# Patient Record
Sex: Female | Born: 1965 | Race: White | Hispanic: No | Marital: Single | Smoking: Never smoker
Health system: Southern US, Community
[De-identification: ages and names within clinical notes are randomized; demographics above are authoritative.]

## PROBLEM LIST (undated history)

## (undated) DIAGNOSIS — M722 Plantar fascial fibromatosis: Secondary | ICD-10-CM

## (undated) DIAGNOSIS — M1990 Unspecified osteoarthritis, unspecified site: Secondary | ICD-10-CM

## (undated) DIAGNOSIS — K219 Gastro-esophageal reflux disease without esophagitis: Secondary | ICD-10-CM

## (undated) DIAGNOSIS — G4730 Sleep apnea, unspecified: Secondary | ICD-10-CM

## (undated) DIAGNOSIS — E079 Disorder of thyroid, unspecified: Secondary | ICD-10-CM

## (undated) DIAGNOSIS — I10 Essential (primary) hypertension: Secondary | ICD-10-CM

## (undated) DIAGNOSIS — G5620 Lesion of ulnar nerve, unspecified upper limb: Secondary | ICD-10-CM

## (undated) DIAGNOSIS — I4891 Unspecified atrial fibrillation: Secondary | ICD-10-CM

## (undated) DIAGNOSIS — F329 Major depressive disorder, single episode, unspecified: Secondary | ICD-10-CM

## (undated) DIAGNOSIS — G4733 Obstructive sleep apnea (adult) (pediatric): Secondary | ICD-10-CM

## (undated) DIAGNOSIS — T4145XA Adverse effect of unspecified anesthetic, initial encounter: Secondary | ICD-10-CM

## (undated) DIAGNOSIS — R112 Nausea with vomiting, unspecified: Secondary | ICD-10-CM

## (undated) DIAGNOSIS — G5601 Carpal tunnel syndrome, right upper limb: Secondary | ICD-10-CM

## (undated) DIAGNOSIS — T8859XA Other complications of anesthesia, initial encounter: Secondary | ICD-10-CM

## (undated) DIAGNOSIS — R079 Chest pain, unspecified: Secondary | ICD-10-CM

## (undated) DIAGNOSIS — J45909 Unspecified asthma, uncomplicated: Secondary | ICD-10-CM

## (undated) DIAGNOSIS — T7840XA Allergy, unspecified, initial encounter: Secondary | ICD-10-CM

## (undated) DIAGNOSIS — S82899A Other fracture of unspecified lower leg, initial encounter for closed fracture: Secondary | ICD-10-CM

## (undated) DIAGNOSIS — R002 Palpitations: Secondary | ICD-10-CM

## (undated) DIAGNOSIS — R011 Cardiac murmur, unspecified: Secondary | ICD-10-CM

## (undated) DIAGNOSIS — Z9889 Other specified postprocedural states: Secondary | ICD-10-CM

## (undated) HISTORY — DX: Unspecified osteoarthritis, unspecified site: M19.90

## (undated) HISTORY — DX: Major depressive disorder, single episode, unspecified: F32.9

## (undated) HISTORY — DX: Gastro-esophageal reflux disease without esophagitis: K21.9

## (undated) HISTORY — DX: Essential (primary) hypertension: I10

## (undated) HISTORY — DX: Allergy, unspecified, initial encounter: T78.40XA

## (undated) HISTORY — PX: ABDOMINAL HYSTERECTOMY: SHX81

## (undated) HISTORY — DX: Disorder of thyroid, unspecified: E07.9

## (undated) HISTORY — PX: CHOLECYSTECTOMY: SHX55

## (undated) HISTORY — DX: Sleep apnea, unspecified: G47.30

## (undated) HISTORY — DX: Cardiac murmur, unspecified: R01.1

## (undated) HISTORY — PX: FRACTURE SURGERY: SHX138

---
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
## 1998-02-15 ENCOUNTER — Ambulatory Visit (HOSPITAL_COMMUNITY): Admission: RE | Admit: 1998-02-15 | Discharge: 1998-02-15 | Payer: Self-pay | Admitting: Anesthesiology

## 1998-03-01 ENCOUNTER — Encounter: Admission: RE | Admit: 1998-03-01 | Discharge: 1998-05-30 | Payer: Self-pay | Admitting: *Deleted

## 1998-08-02 ENCOUNTER — Inpatient Hospital Stay (HOSPITAL_COMMUNITY): Admission: AD | Admit: 1998-08-02 | Discharge: 1998-08-02 | Payer: Self-pay | Admitting: Obstetrics and Gynecology

## 1998-08-17 ENCOUNTER — Emergency Department (HOSPITAL_COMMUNITY): Admission: EM | Admit: 1998-08-17 | Discharge: 1998-08-17 | Payer: Self-pay | Admitting: Emergency Medicine

## 1998-08-29 ENCOUNTER — Inpatient Hospital Stay (HOSPITAL_COMMUNITY): Admission: AD | Admit: 1998-08-29 | Discharge: 1998-08-29 | Payer: Self-pay | Admitting: Obstetrics and Gynecology

## 1999-01-09 ENCOUNTER — Encounter: Admission: RE | Admit: 1999-01-09 | Discharge: 1999-01-23 | Payer: Self-pay | Admitting: *Deleted

## 1999-06-16 ENCOUNTER — Ambulatory Visit (HOSPITAL_COMMUNITY): Admission: RE | Admit: 1999-06-16 | Discharge: 1999-06-16 | Payer: Self-pay | Admitting: Gastroenterology

## 1999-08-14 ENCOUNTER — Other Ambulatory Visit: Admission: RE | Admit: 1999-08-14 | Discharge: 1999-08-14 | Payer: Self-pay | Admitting: Obstetrics and Gynecology

## 1999-09-10 ENCOUNTER — Emergency Department (HOSPITAL_COMMUNITY): Admission: EM | Admit: 1999-09-10 | Discharge: 1999-09-10 | Payer: Self-pay | Admitting: Emergency Medicine

## 1999-09-10 ENCOUNTER — Encounter: Payer: Self-pay | Admitting: Emergency Medicine

## 1999-09-17 ENCOUNTER — Ambulatory Visit (HOSPITAL_COMMUNITY): Admission: RE | Admit: 1999-09-17 | Discharge: 1999-09-17 | Payer: Self-pay | Admitting: Family Medicine

## 2000-04-05 ENCOUNTER — Encounter: Payer: Self-pay | Admitting: Occupational Medicine

## 2000-04-05 ENCOUNTER — Encounter: Admission: RE | Admit: 2000-04-05 | Discharge: 2000-04-05 | Payer: Self-pay | Admitting: Occupational Medicine

## 2001-01-21 ENCOUNTER — Other Ambulatory Visit: Admission: RE | Admit: 2001-01-21 | Discharge: 2001-01-21 | Payer: Self-pay | Admitting: Obstetrics and Gynecology

## 2002-03-09 ENCOUNTER — Other Ambulatory Visit: Admission: RE | Admit: 2002-03-09 | Discharge: 2002-03-09 | Payer: Self-pay | Admitting: Obstetrics and Gynecology

## 2002-06-19 ENCOUNTER — Encounter: Admission: RE | Admit: 2002-06-19 | Discharge: 2002-06-19 | Payer: Self-pay | Admitting: Obstetrics and Gynecology

## 2002-06-19 ENCOUNTER — Encounter: Payer: Self-pay | Admitting: Obstetrics and Gynecology

## 2002-07-11 ENCOUNTER — Ambulatory Visit (HOSPITAL_COMMUNITY): Admission: RE | Admit: 2002-07-11 | Discharge: 2002-07-11 | Payer: Self-pay | Admitting: Family Medicine

## 2002-07-11 ENCOUNTER — Encounter: Payer: Self-pay | Admitting: Family Medicine

## 2002-08-08 ENCOUNTER — Ambulatory Visit (HOSPITAL_COMMUNITY): Admission: RE | Admit: 2002-08-08 | Discharge: 2002-08-08 | Payer: Self-pay | Admitting: Family Medicine

## 2002-08-08 ENCOUNTER — Encounter: Payer: Self-pay | Admitting: Cardiology

## 2003-03-24 HISTORY — PX: LAPAROSCOPIC GASTRIC BYPASS: SUR771

## 2003-04-16 ENCOUNTER — Other Ambulatory Visit: Admission: RE | Admit: 2003-04-16 | Discharge: 2003-04-16 | Payer: Self-pay | Admitting: Obstetrics and Gynecology

## 2003-06-08 ENCOUNTER — Ambulatory Visit (HOSPITAL_COMMUNITY): Admission: RE | Admit: 2003-06-08 | Discharge: 2003-06-08 | Payer: Self-pay | Admitting: Obstetrics and Gynecology

## 2003-07-04 ENCOUNTER — Encounter: Admission: RE | Admit: 2003-07-04 | Discharge: 2003-10-02 | Payer: Self-pay | Admitting: General Surgery

## 2003-07-20 ENCOUNTER — Encounter (INDEPENDENT_AMBULATORY_CARE_PROVIDER_SITE_OTHER): Payer: Self-pay | Admitting: Specialist

## 2003-07-20 ENCOUNTER — Ambulatory Visit (HOSPITAL_COMMUNITY): Admission: RE | Admit: 2003-07-20 | Discharge: 2003-07-20 | Payer: Self-pay | Admitting: Obstetrics and Gynecology

## 2003-09-06 ENCOUNTER — Ambulatory Visit: Admission: RE | Admit: 2003-09-06 | Discharge: 2003-09-06 | Payer: Self-pay | Admitting: Critical Care Medicine

## 2003-09-07 ENCOUNTER — Ambulatory Visit (HOSPITAL_COMMUNITY): Admission: RE | Admit: 2003-09-07 | Discharge: 2003-09-07 | Payer: Self-pay | Admitting: Critical Care Medicine

## 2003-10-03 ENCOUNTER — Encounter: Admission: RE | Admit: 2003-10-03 | Discharge: 2003-10-03 | Payer: Self-pay | Admitting: General Surgery

## 2003-10-13 ENCOUNTER — Emergency Department (HOSPITAL_COMMUNITY): Admission: EM | Admit: 2003-10-13 | Discharge: 2003-10-13 | Payer: Self-pay | Admitting: Emergency Medicine

## 2004-01-01 ENCOUNTER — Inpatient Hospital Stay (HOSPITAL_COMMUNITY): Admission: RE | Admit: 2004-01-01 | Discharge: 2004-01-04 | Payer: Self-pay | Admitting: General Surgery

## 2004-01-08 ENCOUNTER — Encounter: Admission: RE | Admit: 2004-01-08 | Discharge: 2004-04-07 | Payer: Self-pay | Admitting: General Surgery

## 2004-03-23 HISTORY — PX: LAPAROSCOPIC GASTRIC BYPASS: SUR771

## 2004-04-30 ENCOUNTER — Other Ambulatory Visit: Admission: RE | Admit: 2004-04-30 | Discharge: 2004-04-30 | Payer: Self-pay | Admitting: Obstetrics and Gynecology

## 2004-06-16 ENCOUNTER — Encounter: Admission: RE | Admit: 2004-06-16 | Discharge: 2004-09-14 | Payer: Self-pay | Admitting: General Surgery

## 2004-06-23 ENCOUNTER — Encounter (INDEPENDENT_AMBULATORY_CARE_PROVIDER_SITE_OTHER): Payer: Self-pay | Admitting: Specialist

## 2004-06-23 ENCOUNTER — Encounter (INDEPENDENT_AMBULATORY_CARE_PROVIDER_SITE_OTHER): Payer: Self-pay | Admitting: *Deleted

## 2004-06-23 ENCOUNTER — Ambulatory Visit (HOSPITAL_COMMUNITY): Admission: RE | Admit: 2004-06-23 | Discharge: 2004-06-23 | Payer: Self-pay | Admitting: Obstetrics and Gynecology

## 2004-09-09 ENCOUNTER — Ambulatory Visit: Admission: RE | Admit: 2004-09-09 | Discharge: 2004-09-09 | Payer: Self-pay | Admitting: Gynecologic Oncology

## 2004-09-16 ENCOUNTER — Encounter: Admission: RE | Admit: 2004-09-16 | Discharge: 2004-12-15 | Payer: Self-pay | Admitting: General Surgery

## 2004-09-24 ENCOUNTER — Ambulatory Visit: Payer: Self-pay | Admitting: Critical Care Medicine

## 2004-10-20 ENCOUNTER — Ambulatory Visit (HOSPITAL_BASED_OUTPATIENT_CLINIC_OR_DEPARTMENT_OTHER): Admission: RE | Admit: 2004-10-20 | Discharge: 2004-10-20 | Payer: Self-pay | Admitting: Critical Care Medicine

## 2004-10-30 ENCOUNTER — Ambulatory Visit: Payer: Self-pay | Admitting: Pulmonary Disease

## 2004-11-03 ENCOUNTER — Ambulatory Visit: Payer: Self-pay | Admitting: Critical Care Medicine

## 2004-12-26 ENCOUNTER — Inpatient Hospital Stay (HOSPITAL_COMMUNITY): Admission: AD | Admit: 2004-12-26 | Discharge: 2004-12-26 | Payer: Self-pay | Admitting: Obstetrics and Gynecology

## 2005-01-16 ENCOUNTER — Inpatient Hospital Stay (HOSPITAL_COMMUNITY): Admission: AD | Admit: 2005-01-16 | Discharge: 2005-01-16 | Payer: Self-pay | Admitting: Obstetrics and Gynecology

## 2005-02-05 ENCOUNTER — Encounter (INDEPENDENT_AMBULATORY_CARE_PROVIDER_SITE_OTHER): Payer: Self-pay | Admitting: *Deleted

## 2005-02-05 ENCOUNTER — Ambulatory Visit (HOSPITAL_COMMUNITY): Admission: RE | Admit: 2005-02-05 | Discharge: 2005-02-05 | Payer: Self-pay | Admitting: Obstetrics and Gynecology

## 2005-04-17 ENCOUNTER — Ambulatory Visit: Payer: Self-pay | Admitting: Critical Care Medicine

## 2005-06-04 ENCOUNTER — Other Ambulatory Visit: Admission: RE | Admit: 2005-06-04 | Discharge: 2005-06-04 | Payer: Self-pay | Admitting: Obstetrics and Gynecology

## 2005-07-09 ENCOUNTER — Ambulatory Visit: Payer: Self-pay | Admitting: Cardiology

## 2005-07-16 ENCOUNTER — Ambulatory Visit: Payer: Self-pay | Admitting: Cardiology

## 2005-08-07 ENCOUNTER — Ambulatory Visit (HOSPITAL_COMMUNITY): Admission: RE | Admit: 2005-08-07 | Discharge: 2005-08-07 | Payer: Self-pay | Admitting: Surgery

## 2005-08-12 ENCOUNTER — Ambulatory Visit (HOSPITAL_COMMUNITY): Admission: RE | Admit: 2005-08-12 | Discharge: 2005-08-12 | Payer: Self-pay | Admitting: General Surgery

## 2005-08-14 ENCOUNTER — Ambulatory Visit: Payer: Self-pay | Admitting: Critical Care Medicine

## 2005-10-22 ENCOUNTER — Encounter: Admission: RE | Admit: 2005-10-22 | Discharge: 2005-10-22 | Payer: Self-pay | Admitting: Obstetrics and Gynecology

## 2006-02-15 ENCOUNTER — Ambulatory Visit: Payer: Self-pay | Admitting: Critical Care Medicine

## 2006-02-24 ENCOUNTER — Ambulatory Visit: Payer: Self-pay | Admitting: Critical Care Medicine

## 2006-04-01 ENCOUNTER — Ambulatory Visit: Payer: Self-pay | Admitting: Critical Care Medicine

## 2006-04-23 ENCOUNTER — Ambulatory Visit: Payer: Self-pay | Admitting: Critical Care Medicine

## 2006-05-16 ENCOUNTER — Inpatient Hospital Stay (HOSPITAL_COMMUNITY): Admission: AD | Admit: 2006-05-16 | Discharge: 2006-05-16 | Payer: Self-pay | Admitting: Obstetrics and Gynecology

## 2006-06-21 ENCOUNTER — Ambulatory Visit: Payer: Self-pay | Admitting: Critical Care Medicine

## 2006-07-18 ENCOUNTER — Inpatient Hospital Stay (HOSPITAL_COMMUNITY): Admission: AD | Admit: 2006-07-18 | Discharge: 2006-07-18 | Payer: Self-pay | Admitting: Obstetrics and Gynecology

## 2006-07-21 ENCOUNTER — Inpatient Hospital Stay (HOSPITAL_COMMUNITY): Admission: AD | Admit: 2006-07-21 | Discharge: 2006-07-21 | Payer: Self-pay | Admitting: Obstetrics and Gynecology

## 2006-08-03 ENCOUNTER — Inpatient Hospital Stay (HOSPITAL_COMMUNITY): Admission: AD | Admit: 2006-08-03 | Discharge: 2006-08-07 | Payer: Self-pay | Admitting: Obstetrics and Gynecology

## 2006-08-04 ENCOUNTER — Encounter (INDEPENDENT_AMBULATORY_CARE_PROVIDER_SITE_OTHER): Payer: Self-pay | Admitting: Specialist

## 2006-08-20 ENCOUNTER — Inpatient Hospital Stay (HOSPITAL_COMMUNITY): Admission: AD | Admit: 2006-08-20 | Discharge: 2006-08-22 | Payer: Self-pay | Admitting: Obstetrics and Gynecology

## 2006-10-13 ENCOUNTER — Ambulatory Visit: Payer: Self-pay | Admitting: Critical Care Medicine

## 2006-11-05 ENCOUNTER — Ambulatory Visit (HOSPITAL_COMMUNITY): Admission: RE | Admit: 2006-11-05 | Discharge: 2006-11-05 | Payer: Self-pay | Admitting: Orthopedic Surgery

## 2006-11-29 ENCOUNTER — Ambulatory Visit: Payer: Self-pay | Admitting: Cardiology

## 2006-12-06 ENCOUNTER — Ambulatory Visit: Payer: Self-pay | Admitting: Cardiology

## 2007-01-10 DIAGNOSIS — G4710 Hypersomnia, unspecified: Secondary | ICD-10-CM | POA: Insufficient documentation

## 2007-01-10 DIAGNOSIS — G4730 Sleep apnea, unspecified: Secondary | ICD-10-CM

## 2007-01-10 DIAGNOSIS — J45909 Unspecified asthma, uncomplicated: Secondary | ICD-10-CM | POA: Insufficient documentation

## 2007-02-09 ENCOUNTER — Encounter: Payer: Self-pay | Admitting: Family Medicine

## 2007-03-24 LAB — CONVERTED CEMR LAB: Pap Smear: NORMAL

## 2007-05-11 ENCOUNTER — Ambulatory Visit: Payer: Self-pay | Admitting: Critical Care Medicine

## 2007-05-11 DIAGNOSIS — K219 Gastro-esophageal reflux disease without esophagitis: Secondary | ICD-10-CM | POA: Insufficient documentation

## 2007-05-11 DIAGNOSIS — J309 Allergic rhinitis, unspecified: Secondary | ICD-10-CM | POA: Insufficient documentation

## 2007-05-11 HISTORY — DX: Gastro-esophageal reflux disease without esophagitis: K21.9

## 2007-05-12 DIAGNOSIS — G4733 Obstructive sleep apnea (adult) (pediatric): Secondary | ICD-10-CM | POA: Insufficient documentation

## 2007-05-12 DIAGNOSIS — G4730 Sleep apnea, unspecified: Secondary | ICD-10-CM

## 2007-10-27 ENCOUNTER — Encounter: Admission: RE | Admit: 2007-10-27 | Discharge: 2007-10-27 | Payer: Self-pay | Admitting: Obstetrics and Gynecology

## 2008-06-18 ENCOUNTER — Ambulatory Visit: Payer: Self-pay | Admitting: Family Medicine

## 2008-06-18 DIAGNOSIS — I10 Essential (primary) hypertension: Secondary | ICD-10-CM

## 2008-06-18 DIAGNOSIS — J209 Acute bronchitis, unspecified: Secondary | ICD-10-CM | POA: Insufficient documentation

## 2008-06-18 HISTORY — DX: Essential (primary) hypertension: I10

## 2008-06-20 ENCOUNTER — Ambulatory Visit: Payer: Self-pay | Admitting: Critical Care Medicine

## 2008-07-31 ENCOUNTER — Ambulatory Visit: Payer: Self-pay | Admitting: Family Medicine

## 2008-07-31 DIAGNOSIS — A088 Other specified intestinal infections: Secondary | ICD-10-CM | POA: Insufficient documentation

## 2008-08-01 LAB — CONVERTED CEMR LAB
CO2: 25 meq/L (ref 19–32)
Chloride: 114 meq/L — ABNORMAL HIGH (ref 96–112)
Glucose, Bld: 89 mg/dL (ref 70–99)
Sodium: 143 meq/L (ref 135–145)

## 2008-08-07 ENCOUNTER — Encounter (INDEPENDENT_AMBULATORY_CARE_PROVIDER_SITE_OTHER): Payer: Self-pay | Admitting: Obstetrics and Gynecology

## 2008-08-07 ENCOUNTER — Ambulatory Visit (HOSPITAL_COMMUNITY): Admission: RE | Admit: 2008-08-07 | Discharge: 2008-08-08 | Payer: Self-pay | Admitting: Obstetrics and Gynecology

## 2008-08-13 DIAGNOSIS — M722 Plantar fascial fibromatosis: Secondary | ICD-10-CM | POA: Insufficient documentation

## 2008-08-13 DIAGNOSIS — N809 Endometriosis, unspecified: Secondary | ICD-10-CM | POA: Insufficient documentation

## 2008-08-13 DIAGNOSIS — E282 Polycystic ovarian syndrome: Secondary | ICD-10-CM | POA: Insufficient documentation

## 2008-08-13 DIAGNOSIS — E669 Obesity, unspecified: Secondary | ICD-10-CM | POA: Insufficient documentation

## 2008-08-13 DIAGNOSIS — I491 Atrial premature depolarization: Secondary | ICD-10-CM | POA: Insufficient documentation

## 2008-09-03 ENCOUNTER — Ambulatory Visit: Payer: Self-pay | Admitting: Family Medicine

## 2008-09-03 DIAGNOSIS — L259 Unspecified contact dermatitis, unspecified cause: Secondary | ICD-10-CM | POA: Insufficient documentation

## 2008-10-24 ENCOUNTER — Ambulatory Visit: Payer: Self-pay | Admitting: Critical Care Medicine

## 2009-01-28 ENCOUNTER — Encounter: Admission: RE | Admit: 2009-01-28 | Discharge: 2009-01-28 | Payer: Self-pay | Admitting: Obstetrics and Gynecology

## 2009-10-28 ENCOUNTER — Ambulatory Visit: Payer: Self-pay | Admitting: Family Medicine

## 2009-10-28 DIAGNOSIS — F329 Major depressive disorder, single episode, unspecified: Secondary | ICD-10-CM

## 2009-10-28 DIAGNOSIS — F3289 Other specified depressive episodes: Secondary | ICD-10-CM

## 2009-10-28 DIAGNOSIS — I7300 Raynaud's syndrome without gangrene: Secondary | ICD-10-CM | POA: Insufficient documentation

## 2009-10-28 DIAGNOSIS — F321 Major depressive disorder, single episode, moderate: Secondary | ICD-10-CM | POA: Insufficient documentation

## 2009-10-28 HISTORY — DX: Other specified depressive episodes: F32.89

## 2009-10-28 HISTORY — DX: Major depressive disorder, single episode, unspecified: F32.9

## 2010-01-02 ENCOUNTER — Ambulatory Visit: Payer: Self-pay | Admitting: Family Medicine

## 2010-04-03 ENCOUNTER — Ambulatory Visit
Admission: RE | Admit: 2010-04-03 | Discharge: 2010-04-03 | Payer: Self-pay | Source: Home / Self Care | Attending: Family Medicine | Admitting: Family Medicine

## 2010-04-12 ENCOUNTER — Encounter: Payer: Self-pay | Admitting: General Surgery

## 2010-04-14 ENCOUNTER — Encounter: Payer: Self-pay | Admitting: Obstetrics and Gynecology

## 2010-04-22 NOTE — Assessment & Plan Note (Signed)
Summary: flu shot/njr   Nurse Visit   Allergies: 1)  ! [facility]vs Latex Gloves Medium (Disposable Gloves) 2)  ! Augmentin 3)  ! Erythromycin  Orders Added: 1)  Admin 1st Vaccine [90471] 2)  Flu Vaccine 1yrs + [[address]] Flu Vaccine Consent Questions     Do you have a history of severe allergic reactions to this vaccine? no    Any prior history of allergic reactions to egg and/or gelatin? no    Do you have a sensitivity to the preservative Thimersol? no    Do you have a past history of Guillan-Barre Syndrome? no    Do you currently have an acute febrile illness? no    Have you ever had a severe reaction to latex? no    Vaccine information given and explained to patient? yes    Are you currently pregnant? no    Lot Number:[id number]   Exp Date:09/20/2010   Site Given  Left Deltoid IM Romualdo Bolk, CMA (AAMA)  January 02, 2010 1:54 PM

## 2010-04-22 NOTE — Assessment & Plan Note (Signed)
Summary: med check and refill/cjr   Vital Signs:  Patient profile:   45 year old female Weight:      246 pounds Temp:     98.0 degrees F oral BP sitting:   110 / 72  (left arm) Cuff size:   large  Vitals Entered By: Sid Falcon LPN (October 28, 2009 1:20 PM) CC: med refills   History of Present Illness: Patient here for followup hypertension. She remains on Cardizem. Blood pressures been well-controlled. She has history of Raynaud's phenomenon still has occasional breakthrough episodes with this. She has been on dihydropyridine calcium channel blocker the past but had problems with edema.  She has no history of depression and anxiety symptoms controlled with Prozac 40 mg daily. Needs refills. No side effects from medication.  Compliant with all meds.  Asthma well controlled. Recent visit with pulmonologist.  Allergies: 1)  ! Cvs Latex Gloves Medium (Disposable Gloves) 2)  ! Augmentin 3)  ! Erythromycin  Past History:  Past Medical History: Last updated: 08/13/2008 Asthma Sleep Apnea Morbid Obesity with weight loss after Bariatric surgery Arthritis Depression Frequent Headaches/Migraines Hypertension Hay Fever/Allergies Heart Murmur osteopenia hx paroxysmal atrial tachycardia endometriosis Raynaud's Allergic rhinitis GERD  Past Surgical History: Last updated: 08/13/2008 Gastric Bypass 2005 Lap Laser Endometriosis D&C 2006 C-Section 2008 Cholecystectomy 1995 wisdom teeth ext  1989  Family History: Last updated: 08/13/2008 Asthma MI/Heart Attack Family History High cholesterol Family History Hypertension Family History of Cardiovascular disorder Diabetes parent atrial fib mother Family History of Cervical cancer Family History of Skin cancer--basal cell  father Family History Breast cancer 1st degree relative <50--aunt after age 59  Social History: Last updated: 06/18/2008 Patient never smoked.  RN case Production designer, theatre/television/film at [employer]  Alcohol  use-no Single  Risk Factors: Smoking Status: never (10/24/2008) PMH-FH-SH reviewed for relevance  Review of Systems  The patient denies anorexia, fever, weight loss, weight gain, chest pain, syncope, dyspnea on exertion, peripheral edema, prolonged cough, headaches, hemoptysis, abdominal pain, melena, hematochezia, and severe indigestion/heartburn.    Physical Exam  General:  Well-developed,well-nourished,in no acute distress; alert,appropriate and cooperative throughout examination Head:  Normocephalic and atraumatic without obvious abnormalities. No apparent alopecia or balding. Eyes:  No corneal or conjunctival inflammation noted. EOMI. Perrla. Funduscopic exam benign, without hemorrhages, exudates or papilledema. Vision grossly normal. Ears:  External ear exam shows no significant lesions or deformities.  Otoscopic examination reveals clear canals, tympanic membranes are intact bilaterally without bulging, retraction, inflammation or discharge. Hearing is grossly normal bilaterally. Mouth:  Oral mucosa and oropharynx without lesions or exudates.  Teeth in good repair. Neck:  No deformities, masses, or tenderness noted. Lungs:  Normal respiratory effort, chest expands symmetrically. Lungs are clear to auscultation, no crackles or wheezes. Heart:  normal rate and regular rhythm.  soft 1/6 systolic murmur left sternal border and right upper sternal border. Extremities:  no edema Cervical Nodes:  No lymphadenopathy noted Psych:  normally interactive, good eye contact, not anxious appearing, and not depressed appearing.     Impression & Recommendations:  Problem # 1:  HYPERTENSION (ICD-401.9) controlled. Her updated medication list for this problem includes:    Cardizem La 240 Mg Xr24h-tab (Diltiazem hcl coated beads) ..... Once daily  Problem # 2:  DEPRESSION (ICD-311) refilled Prozac for one year. Her updated medication list for this problem includes:    Prozac 40 Mg Caps  (Fluoxetine hcl) ..... Once daily  Problem # 3:  RAYNAUDS SYNDROME (ICD-443.0) prior intolerance to dihidroperidine Ca channel  blockers.  Complete Medication List: 1)  Qvar 40 Mcg/act Aers (Beclomethasone dipropionate) .... Two puff two times a day 2)  Nasonex 50 Mcg/act Susp (Mometasone furoate) .... Two puff ea nostril once daily 3)  Cardizem La 240 Mg Xr24h-tab (Diltiazem hcl coated beads) .... Once daily 4)  Multivitamins Tabs (Multiple vitamin) .Marland Kitchen.. 1 by mouth daily 5)  Folic Acid 800 Mcg Tabs (Folic acid) .Marland Kitchen.. 1 by mouth daily 6)  Proair Hfa 108 (90 Base) Mcg/act Aers (Albuterol sulfate) .Marland Kitchen.. 1-2 puffs every 4-6 hours as needed 7)  Prevacid 15 Mg Cpdr (Lansoprazole) .... As needed 8)  Skelaxin 800 Mg Tabs (Metaxalone) .... One tab at bedtime as needed 9)  Zofran 8 Mg Tabs (Ondansetron hcl) .... As needed 10)  Topamax 100 Mg Tabs (Topiramate) .... One tab daily at hs 11)  Prozac 40 Mg Caps (Fluoxetine hcl) .... Once daily 12)  Relpax 40 Mg Tabs (Eletriptan hydrobromide) .... As needed for migraines 13)  Ambien Cr 12.5 Mg Cr-tabs (Zolpidem tartrate) .... As needed 14)  Vitamin B-12 1000 Mcg Tabs (Cyanocobalamin) .... Once daily 15)  Chewable Calcium 500-200-40 Mg-unt-mcg Chew (Calcium-vitamin d-vitamin k) .... Two times a day 16)  Calcium 1200 1200-1000 Mg-unit Chew (Calcium carbonate-vit d-min) .... Once daily 17)  Vitamin D3 1000 Unit Caps (Cholecalciferol) .... Once daily 18)  Vivelle-dot 0.1 Mg/24hr Pttw (Estradiol) .... One patch 2 times a week  Patient Instructions: 1)  It is important that you exercise reguarly at least 20 minutes 5 times a week. If you develop chest pain, have severe difficulty breathing, or feel very tired, stop exercising immediately and seek medical attention.  2)  You need to lose weight. Consider a lower calorie diet and regular exercise.  3)  Check your  Blood Pressure regularly . If it is above: 140/90  you should make an  appointment. Prescriptions: PROZAC 40 MG CAPS (FLUOXETINE HCL) once daily  #90 x 3   Entered and Authorized by:   Evelena Peat MD   Signed by:   Evelena Peat MD on 10/28/2009   Method used:   Electronically to        [facility]* (retail)       [address]       [address] Shipping/mailing       [place], [place]  [address]       Ph: [phone]       Fax: [phone]   RxID:   [id number]

## 2010-04-24 NOTE — Assessment & Plan Note (Signed)
Summary: rash on neck/njr   Vital Signs:  Patient profile:   45 year old female Weight:      247 pounds Temp:     97.8 degrees F oral BP sitting:   102 / 72  (left arm) Cuff size:   large  Vitals Entered By: Sid Falcon LPN (April 03, 2010 2:25 PM)  History of Present Illness: Pruritic rash anterior neck present for 3 days. Initially thought this was related to stethoscope. No clear precipitant. Took benadryl with some relief. Somewhat improved today. No clear exacerbating factors. No pets. No generalized rash. No fever or chills.  Allergies: 1)  ! Cvs Latex Gloves Medium (Disposable Gloves) 2)  ! Augmentin 3)  ! Erythromycin  Past History:  Past Medical History: Last updated: 08/13/2008 Asthma Sleep Apnea Morbid Obesity with weight loss after Bariatric surgery Arthritis Depression Frequent Headaches/Migraines Hypertension Hay Fever/Allergies Heart Murmur osteopenia hx paroxysmal atrial tachycardia endometriosis Raynaud's Allergic rhinitis GERD PMH reviewed for relevance  Physical Exam  General:  Well-developed,well-nourished,in no acute distress; alert,appropriate and cooperative throughout examination Neck:  No deformities, masses, or tenderness noted. see skin exam. Lungs:  Normal respiratory effort, chest expands symmetrically. Lungs are clear to auscultation, no crackles or wheezes. Heart:  Normal rate and regular rhythm. S1 and S2 normal without gallop, murmur, click, rub or other extra sounds. Skin:  nonspecific macular erythematous rash which blanches with pressure anteriorly. No scaling no pustules or vesicles.   Impression & Recommendations:  Problem # 1:  SKIN RASH, ALLERGIC (ICD-692.9) steroid cream as below and cont antihistamine Her updated medication list for this problem includes:    Triamcinolone Acetonide 0.1 % Crea (Triamcinolone acetonide) .Marland Kitchen... Apply to affected rash two times a day as needed  Complete Medication List: 1)  Qvar 40  Mcg/act Aers (Beclomethasone dipropionate) .... Two puff two times a day 2)  Nasonex 50 Mcg/act Susp (Mometasone furoate) .... Two puff ea nostril once daily 3)  Cardizem La 240 Mg Xr24h-tab (Diltiazem hcl coated beads) .... Once daily 4)  Multivitamins Tabs (Multiple vitamin) .Marland Kitchen.. 1 by mouth daily 5)  Folic Acid 800 Mcg Tabs (Folic acid) .Marland Kitchen.. 1 by mouth daily 6)  Proair Hfa 108 (90 Base) Mcg/act Aers (Albuterol sulfate) .Marland Kitchen.. 1-2 puffs every 4-6 hours as needed 7)  Prevacid 15 Mg Cpdr (Lansoprazole) .... As needed 8)  Skelaxin 800 Mg Tabs (Metaxalone) .... One tab at bedtime as needed 9)  Zofran 8 Mg Tabs (Ondansetron hcl) .... As needed 10)  Topamax 100 Mg Tabs (Topiramate) .... One tab daily at hs 11)  Prozac 40 Mg Caps (Fluoxetine hcl) .... Once daily 12)  Relpax 40 Mg Tabs (Eletriptan hydrobromide) .... As needed for migraines 13)  Ambien Cr 12.5 Mg Cr-tabs (Zolpidem tartrate) .... As needed 14)  Vitamin B-12 1000 Mcg Tabs (Cyanocobalamin) .... Once daily 15)  Chewable Calcium 500-200-40 Mg-unt-mcg Chew (Calcium-vitamin d-vitamin k) .... Two times a day 16)  Calcium 1200 1200-1000 Mg-unit Chew (Calcium carbonate-vit d-min) .... Once daily 17)  Vitamin D3 1000 Unit Caps (Cholecalciferol) .... Once daily 18)  Vivelle-dot 0.1 Mg/24hr Pttw (Estradiol) .... One patch 2 times a week 19)  Triamcinolone Acetonide 0.1 % Crea (Triamcinolone acetonide) .... Apply to affected rash two times a day as needed  Patient Instructions: 1)  Continue Benadryl or other antihistamine as needed. Prescriptions: TRIAMCINOLONE ACETONIDE 0.1 % CREA (TRIAMCINOLONE ACETONIDE) apply to affected rash two times a day as needed  #30 gm x 1   Entered and Authorized by:  Evelena Peat MD   Signed by:   Evelena Peat MD on 04/03/2010   Method used:   Electronically to        [facility]* (retail)       [address]       [address] Shipping/mailing       [place], [place]  [address]        Ph: [phone]       Fax: [phone]   RxID:   [id number]    Orders Added: 1)  Est. Patient Level III [phone]

## 2010-05-19 ENCOUNTER — Encounter: Payer: Self-pay | Admitting: Family Medicine

## 2010-05-19 ENCOUNTER — Ambulatory Visit (INDEPENDENT_AMBULATORY_CARE_PROVIDER_SITE_OTHER): Payer: 59 | Admitting: Family Medicine

## 2010-05-19 VITALS — BP 118/80 | Temp 98.2°F | Ht 70.0 in | Wt 250.0 lb

## 2010-05-19 DIAGNOSIS — R42 Dizziness and giddiness: Secondary | ICD-10-CM

## 2010-05-19 NOTE — Progress Notes (Signed)
  Subjective:    Patient ID: [name], female    DOB: October 09, 1965, 45 y.o.   MRN: [id number]  HPI  Patient is seen with 4 weeks of intermittent vertigo-type symptoms. Worse first thing in the morning and when rolling to the left side. Symptoms seem to be worse supine. Denies any lightheadedness or presyncopal or syncopal type symptoms. Denies nausea, vomiting, or hearing changes. No ataxia. No nasal congestion but did have cold-like symptoms at onset. No alleviating factors.   Review of Systems  Neurological: Positive for dizziness. Negative for tremors, seizures, syncope and light-headedness.  Hematological: Negative for adenopathy.       Objective:   Physical Exam  patient is alert and in no distress.  Pupils equal round reactive to light. Fundi benign Eardrums appear normal Oropharynx is clear Neck is supple no adenopathy Chest good auscultation Heart regular rhythm and rate with no murmur Neurologic exam cerebellar function normal by finger to nose testing. No focal strength deficits. Gait normal. No nystagmus at this time. Cranial nerves II through XII intact. Vertigo symptoms produced with head turned to the left in supine to sitting       Assessment & Plan:   Vertigo. Suspect benign positional vertigo. Referral to consider repositioning maneuvers/vestibular rehab given duration of symptoms.

## 2010-07-01 LAB — COMPREHENSIVE METABOLIC PANEL
Albumin: 3.6 g/dL (ref 3.5–5.2)
Alkaline Phosphatase: 106 U/L (ref 39–117)
BUN: 8 mg/dL (ref 6–23)
CO2: 23 mEq/L (ref 19–32)
Chloride: 113 mEq/L — ABNORMAL HIGH (ref 96–112)
Creatinine, Ser: 0.76 mg/dL (ref 0.4–1.2)
GFR calc non Af Amer: >60 mL/min (ref >60)
Glucose, Bld: 94 mg/dL (ref 70–99)
Potassium: 3.4 mEq/L — ABNORMAL LOW (ref 3.5–5.1)
Total Bilirubin: 0.5 mg/dL (ref 0.3–1.2)

## 2010-07-01 LAB — CBC
HCT: 37 % (ref 36.0–46.0)
Hemoglobin: 13 g/dL (ref 12.0–15.0)
MCV: 100 fL (ref 78.0–100.0)
MCV: 97.6 fL (ref 78.0–100.0)
Platelets: 234 10*3/uL (ref 150–400)
Platelets: 227 10*3/uL (ref 150–400)
RBC: 3.24 MIL/uL — ABNORMAL LOW (ref 3.87–5.11)
RBC: 3.79 MIL/uL — ABNORMAL LOW (ref 3.87–5.11)
WBC: 9.5 10*3/uL (ref 4.0–10.5)
WBC: 4.9 10*3/uL (ref 4.0–10.5)

## 2010-08-05 NOTE — Op Note (Signed)
NAME:  [name], [name]              ACCOUNT NO.:  [id number]   MEDICAL RECORD NO.:  [id number]          PATIENT TYPE:  INP   LOCATION:  9123                          FACILITY:  [facility]   PHYSICIAN:  Guy Sandifer. Henderson Cloud, M.D. DATE OF BIRTH:  1965-04-23   DATE OF PROCEDURE:  08/04/2006  DATE OF DISCHARGE:                               OPERATIVE REPORT   PREOPERATIVE DIAGNOSIS:  1. Intrauterine pregnancy at 38-1/7 weeks estimated additional age.  2. Pregnancy-induced hypertension.  3. Failed induction of labor.   POSTOPERATIVE DIAGNOSIS:  1. Intrauterine pregnancy at 38-1/7 weeks estimated additional age.  2. Pregnancy-induced hypertension.  3. Failed induction of labor.   PROCEDURE:  Low-transverse cesarean section.   SURGEON:  Harold Hedge, M.D.   ANESTHESIA:  Spinal, Cristela Blue, M.D.   SPECIMENS:  Placenta to pathology.   ESTIMATED BLOOD LOSS:  800 mL.   FINDINGS:  Viable female infant, Apgars of 8/9 at 1 and 5 minutes  respectively.  Birth weight 7 pounds 3 ounces.  Arterial cord pH 7.29.   INDICATIONS FOR PROCEDURE:  The patient is a 45 year old single white  female G2, P0, EDC of Aug 17, 2006 with chronic hypertension with  increasingly labile blood pressures.  Details dictated history and  physical.  She is admitted to hospital for induction of labor.  She  undergo Cytotec ripening and then Pitocin.  She fails to achieve labor.  Fetal heart tones were reactive.  After discussion of the options, we  proceed to cesarean section.  Potential risks and complications have  been discussed.   PROCEDURE:  The patient is taken to operating room where she is  identified, spinal anesthetic is placed and she is placed in dorsosupine  position with a 15 degrees left lateral wedge.  She is then prepped,  Foley catheter is placed in the bladder as drain and she is draped in  sterile fashion.  After testing for adequate spinal anesthesia skin is  entered through a Pfannenstiel incision  and dissection is carried out  layers to the peritoneum.  Peritoneum is incised, extended superiorly  and inferiorly.  Vesicouterine peritoneum taken down cephalolaterally.  The bladder flap is developed.  The bladder blade was placed.  Uterus is  incised in a low transverse manner.  The uterine cavity is entered  bluntly with a hemostat.  The uterine incision is extended  cephalolaterally with fingers.  Artificial rupture of membranes for  clear fluid is carried out.  The vertex is delivered and the oronasal  pharynx were thoroughly suctioned.  Nuchal cord x1 was reduced.  Baby is  then delivered without difficulty and good cry and tone is noted.  Cord  is clamped and cut.  The baby's handed to waiting pediatrics team.  Placenta was then manually delivered and sent to pathology.  Uterine  cavity is clean.  Uterus closed in two running locking imbricating  layers of 0 Monocryl suture which achieves good hemostasis.  The rectus  fascia is closed in running fashion with 0 PDS suture starting at each  angle and meeting in the middle.  The subcutaneous tissue is  closed with  interrupted plain suture and the skin is closed with clips.  All sponge  and needle counts correct and the patient is transferred to recovery  room in stable condition.      Guy Sandifer Henderson Cloud, M.D.  Electronically Signed     JET/MEDQ  D:  08/04/2006  T:  08/04/2006  Job:  [id number]

## 2010-08-05 NOTE — Assessment & Plan Note (Signed)
[facility]                             PULMONARY OFFICE NOTE   NAME:[name], Carol MEDAGLIA                     MRN:          [id number]  DATE:10/13/2006                            DOB:          12-16-1965    This is a 45 year old white female with history of moderate persistent  asthma, obstructive sleep apnea, who recently delivered her baby with C-  section.  She is overall improving with less shortness of breath,  decreased cough.  She is on Qvar 2 sprays b.i.d. 40 mcg strength,  Nasonex 2 sprays to each nostril daily, Zegerid 40 mg daily.   PHYSICAL EXAMINATION:  VITAL SIGNS:  Temperature 99, blood pressure  130/80, pulse 71, saturation 90% in room air.  CHEST:  Showed to be completely clear without evidence of adventitious  breath sounds.  CARDIAC EXAM:  Showed a regular rate and rhythm without S3, normal S1,  S2.  ABDOMEN:  Soft, nontender.  EXTREMITIES:  Showed no edema or clubbing.  SKIN:  Clear.  NEUROLOGIC EXAM:  Intact.  HEENT EXAM:  Showed no jugular venous distension, lymphadenopathy.  Oropharynx clear.  NECK:  Supple.   IMPRESSION:  Is that of stable asthma and airways disease.   PLAN:  Cycle Zegerid to off, maintain Qvar as is, Nasonex as is, and  will see the patient back in return followup in 4 months.     Charlcie Cradle Delford Field, MD, [facility]  Electronically Signed    PEW/MedQ  DD: 10/14/2006  DT: 10/14/2006  Job #: [id number]

## 2010-08-05 NOTE — H&P (Signed)
NAME:  [name], [name]              ACCOUNT NO.:  [id number]   MEDICAL RECORD NO.:  [id number]          PATIENT TYPE:  INP   LOCATION:                                FACILITY:  [facility]   PHYSICIAN:  Guy Sandifer. Henderson Cloud, M.D. DATE OF BIRTH:  16-Mar-1966   DATE OF ADMISSION:  08/03/2006  DATE OF DISCHARGE:                              HISTORY & PHYSICAL   CHIEF COMPLAINT:  Pregnancy-induced hypertension.   HISTORY OF PRESENT ILLNESS:  This patient is a 45 year old, single,  white female, G2, P0, who conceived with artificial insemination with an  EDC of Aug 17, 2006.  Blood pressures have become increasingly labile  and have reached 144/88 at times.  She has also had increasingly severe  dependent edema.  Ultrasound on Aug 02, 2006, revealed an estimated  fetal weight of 7 pounds 2 ounces and an AFI of 12.4.  Cervix was  closed, thick, and high.  She has headaches on a daily basis but denies  epigastric pain.  She is being admitted for two-stage induction of  labor.   PAST MEDICAL HISTORY:  1. Hypertension.  2. PACs.  3. Asthma.  4. Sleep apnea treated with CPAP.  5. Positive ANA with Raynaud's phenomenon.  6. Irritable bowel syndrome and reflux disease.  7. Right Achilles tendinitis.  8. Osteoarthritis of the right knee and foot and the hands.  9. Migraine headaches.  10.History of PCOS and endometriosis.   OBSTETRIC HISTORY:  Ectopic pregnancy in November 2006.   PAST SURGICAL HISTORY:  1. Wisdom tooth extraction in 1989.  2. Laparoscopy in 1992.  3. Laparoscopic cholecystectomy and laser vaporization of      endometriosis 1995.  4. D&C hysteroscopy in 2004 and 2008.  5. Laparoscopic Rouen-Y gastric bypass in 2005.  6. D&C in 2006.   MEDICATIONS:  1. Zegerid 40/1100 daily.  2. Zoloft 50 mg daily.  3. Cardizem LA 240 mg daily.  4. Qvar 40 mcg 2 puffs b.i.d.  5. Zyrtec 10 mg nightly.  6. Nasonex 1 spray each nostril daily.  7. Prenatal vitamin.  8. Albuterol 2 puffs q. 4-6  h p.r.n.  9. Ambien CR 12.5 mg nightly p.r.n.   ALLERGIES:  1. LATEX leading to hives.  2. VINYL contact dermatitis.  3. ERYTHROMYCIN leading to nausea.   FAMILY HISTORY:  Hypertension, diabetes, hyperlipidemia, goiter, atrial  fibrillation in mother.  Basal cell carcinoma in father.  Chronic  hypertension, asthma in brother and sleep apnea in brother.   SOCIAL HISTORY:  Denies tobacco, alcohol, or drug abuse.   REVIEW OF SYSTEMS:  NEUROLOGIC: Headache as above.  CARDIAC: Chest pain.  PULMONARY:  Asthma as above.  GI:  Recent changes in bowel habits with  history of irritable bowel syndrome as above.   PHYSICAL EXAMINATION:  VITAL SIGNS: Height 5 feet 10-1/2 inches.  Weight  273 pounds.  HEENT/NECK:  Without thyromegaly.  LUNGS:  Clear to auscultation.  HEART:  Regular rate and rhythm.  BACK:  Without CVA tenderness.  BREASTS:  Not examined.  ABDOMEN:  Gravid with no epigastric tenderness.  EXTREMITIES:  2+ to  3+ dependent edema.  NEUROLOGIC:  Deep tendon reflexes 2+ without clonus.  PELVIC:  Cervix closed, thick, and high.   ASSESSMENT:  1. Intrauterine pregnancy at 38-1/7 weeks estimated gestational age.  2. Chronic hypertension with evolving pregnancy-induced hypertension.  3. Positive ANA.   PLAN:  Two-stage induction of labor.      Guy Sandifer Henderson Cloud, M.D.  Electronically Signed     JET/MEDQ  D:  08/03/2006  T:  08/03/2006  Job:  [id number]

## 2010-08-05 NOTE — H&P (Signed)
NAME:  [name], [name]              ACCOUNT NO.:  [id number]   MEDICAL RECORD NO.:  [id number]          PATIENT TYPE:  AMB   LOCATION:  SDC                           FACILITY:  [facility]   PHYSICIAN:  Guy Sandifer. Henderson Cloud, M.D. DATE OF BIRTH:  1965/08/06   DATE OF ADMISSION:  DATE OF DISCHARGE:                              HISTORY & PHYSICAL   CHIEF COMPLAINT:  Endometriosis.   HISTORY OF PRESENT ILLNESS:  This patient is a 45 year old single white  female G2, P1, who has a long established history of endometriosis.  She  has increasing pain and heavy bleeding.  She is status post Depot  Lupron, multiple surgeries, and NuvaRing, none of which have given  adequate relief.  Ultrasound on January 17, 2008, in my office revealed  the uterus measuring 8.0 x 4.9 x 5.6 cm.  Adnexa were essentially  normal.  After discussion of options, she is being admitted for  laparoscopically-assisted vaginal hysterectomy with bilateral salpingo-  oophorectomy.  Risks of surgery as well as issues of menopause have been  reviewed preoperatively.   PAST MEDICAL HISTORY:  1. Hypertension.  2. PACs.  3. Asthma.  4. History of sleep apnea.  5. Positive ANA with Raynaud phenomenon.  6. Irritable bowel syndrome and reflux.  7. Osteoarthritis of the right knee, foot, and hands.  8. Migraine headaches.  9. PCOS and insulin resistance.  10.Endometriosis.  11.History of depression.   PAST SURGICAL HISTORY:  Wisdom tooth extraction in 1989, laparoscopy in  1992, laparoscopic cholecystectomy and laser vaporization of  endometriosis in 1995, hysteroscopy D and C in 2004 and 2008,  laparoscopic Roux-en-Y gastric bypass in 2005, and D and C in 2006.   OBSTETRICAL HISTORY:  C-section x1, miscarriage x1.   MEDICATIONS:  1. QVAR 40 mcg 2 puffs b.i.d.  2. Nasonex 50 mcg 2 puffs each nostril daily.  3. Cardizem CD 240 mg daily.  4. Multivitamin twice a day.  5. Folic acid 800 mcg once a day.  6. ProAir HFA 108 mcg 1-2  puffs every 4-6 hours p.r.n.  7. Prevacid 50 mg once a day.  8. Skelaxin 800 mg nightly.  9. Zofran 8 mg p.r.n.  10.Topamax 100 mg nightly.  11.Prozac 40 mg daily.  12.NuvaRing discontinued.  13.Relpax 40 mg p.r.n. migraine.  14.Ambien CR 12.5 mg nightly p.r.n.  15.Calcium twice daily.   ALLERGIES:  LATEX, VINYL, and TAPE ADHESIVE.  She is intolerant of  ERYTHROMYCIN and AUGMENTIN both leading to diarrhea.   FAMILY HISTORY:  Positive for heart disease, asthma, hiatal hernia,  irritable bowel syndrome, thyroid disease, gallbladder disease, UTI,  osteoporosis, diabetes, hypertension, breast and uterine cancer, and  melanoma.   REVIEW OF SYSTEMS:  NEUROLOGIC:  History of migraine headache.  CARDIAC:  History of PACs.  PULMONARY:  History of asthma.  GI: Irritable bowel  syndrome.   PHYSICAL EXAMINATION:  VITAL SIGNS:  Height 5 feet and 10-1/2 inches,  weight 240.8 pounds, and blood pressure 106/66.  LUNGS:  Clear to auscultation.  HEART:  Regular rate and rhythm.  BACK:  No CVA tenderness.  ABDOMEN:  Soft  and nontender without masses.  PELVIC:  Vulva, vagina, and cervix without lesion.  Uterus is upper  normal size, mobile, mildly tender.  Adnexa, mildly tender without  palpable masses.  EXTREMITIES:  Grossly within normal limits.  NEUROLOGIC:  Grossly within normal limits.   ASSESSMENT:  Endometriosis.   PLAN:  Laparoscopically-assisted vaginal hysterectomy with bilateral  salpingo-oophorectomy.      Guy Sandifer Henderson Cloud, M.D.  Electronically Signed     JET/MEDQ  D:  08/01/2008  T:  08/02/2008  Job:  [id number]

## 2010-08-05 NOTE — Assessment & Plan Note (Signed)
[facility]                          [facility] OFFICE NOTE   NAME:[name]                     MRN:          [id number]  DATE:11/29/2006                            DOB:          1965/05/15    HISTORY OF PRESENT ILLNESS:  The patient is a 45 year old female with  prior history of suspected pulmonary hypertension, although an  echocardiographic study last year did not show any significant pulmonary  hypertension.  This lady has a history of obesity and is status post  treatment with Redux (Phen-Phen).  She also has a prior history of  laparoscopic Roux-en-Y gastric bypass procedure in 2005.  She recently  had delivered an artificially inseminated child.  She is a single  mother.  Her pregnancy was complicated by severe volume overload which  required Lasix after C-section.  The patient states that she is now  doing quite well.  She reports no chest pain, shortness of breath,  orthopnea or PND.  She denies any palpitations or syncope.  She remains  concerned about the possibility of underlying pulmonary hypertension.   MEDICATIONS:  Flintstone multivitamins, Os-Cal, folic acid, B12,  Cardizem LA 240 mg p.o. daily, inhalation therapy, Nasonex and Zoloft.   PHYSICAL EXAMINATION:  VITAL SIGNS:  Blood pressure 137/82, heart rate  75, weight 250 pounds.  NECK:  Normal carotid upstroke, no carotid bruits.  LUNGS:  Clear breath sounds bilaterally.  HEART:  Regular rate and rhythm, normal S1, S2.  No murmurs, rubs or  gallops.  ABDOMEN:  Soft, nontender, no rebound or guarding.  Good bowel sounds.  EXTREMITIES:  No clubbing, cyanosis or edema.  NEUROLOGIC:  The patient is alert, oriented and grossly nonfocal.   ASSESSMENT:  1. History of gastric bypass surgery.  2. Recent cesarean section and pregnancy.  3. History of morbid obesity and Redux therapy.  4. History of volume overload.  5. History of normal left ventricular function by  echocardiogram in      2007.  Negative workup for patent foramen ovale.   PLAN:  1. The patient is doing quite well post delivery.  She has no      significant volume overload.  2. Will repeat an echocardiographic study to reassess her PA pressure.  3. The patient can follow up with [place] in 1 year.     Learta Codding, MD,FACC  Electronically Signed    GED/MedQ  DD: 11/29/2006  DT: 11/30/2006  Job #: [id number]   cc:   Charlcie Cradle. Delford Field, MD, FCCP  Evelena Peat, M.D.

## 2010-08-05 NOTE — Op Note (Signed)
NAME:  [name], [name]              ACCOUNT NO.:  [id number]   MEDICAL RECORD NO.:  [id number]          PATIENT TYPE:  OIB   LOCATION:  9316                          FACILITY:  [facility]   PHYSICIAN:  Guy Sandifer. Henderson Cloud, M.D. DATE OF BIRTH:  09-10-1965   DATE OF PROCEDURE:  08/07/2008  DATE OF DISCHARGE:                               OPERATIVE REPORT   PREOPERATIVE DIAGNOSIS:  Endometriosis.   POSTOPERATIVE DIAGNOSIS:  Endometriosis.   PROCEDURE:  Laparoscopically-assisted vaginal hysterectomy with  bilateral salpingo-oophorectomy.   SURGEON:  Guy Sandifer. Henderson Cloud, MD   ASSISTANT:  Duke Salvia. Marcelle Overlie, MD   ANESTHESIA:  General endotracheal intubation.   SPECIMENS:  Uterus, bilateral tubes, and ovaries to pathology.   ESTIMATED BLOOD LOSS:  300 mL.   INDICATIONS AND CONSENT:  This patient is a 45 year old, single, white  female, G2, P1 with endometriosis.  Details are dictated in the history  and physical.  Laparoscopically-assisted vaginal hysterectomy and  bilateral salpingo-oophorectomy is discussed with the patient  preoperatively.  Potential risks and complications were discussed with  the patient preoperatively including, but limited to infection, organ  damage, bleeding requiring transfusion of blood products with HIV and  hepatitis acquisition, DVT, PE, pneumonia, fistula formation, pelvic  pain, laparotomy.  All questions were answered and consent was signed on  the chart.   FINDINGS:  Upper abdomen has 1 adhesion of the omentum to the anterior  abdominal wall.  There are no closed loops.  Uterus is about 6 weeks in  size.  Anterior and posterior cul-de-sacs were normal.  Tubes and  ovaries were normal bilaterally.   PROCEDURE:  The patient was taken to the operating room where she is  identified, placed in dorsal supine position and general anesthesia was  induced via endotracheal intubation.  She is then placed in the dorsal  lithotomy position, prepped abdominally and  vaginally.  Bladder was  straight catheterized.  Hulka tenaculum was placed.  The uterus as a  manipulator and she is draped in sterile fashion.  The infraumbilical  and suprapubic areas were injected in the midline with 1% plain  Xylocaine.  Small infraumbilical incision is made.  A Veress needle was  placed.  A normal syringe and drop test are noted.  Three liters of gas  were then insufflated under low pressure with good tympany in the right  upper quadrant.  A 10/11 Xcel bladeless disposable trocar sleeve is then  placed through the incision under direct visualization with the  operative laparoscopic.  This proceeds all the way to the parietal  peritoneum.  Proper pneumoperitoneum within the peritoneal cavity can be  seen through the peritoneum.  However, the peritoneum pushes away from  the tip of the trocar.  This occurs up to the field of the trocar.  The  trocar was therefore removed.  The same procedure is carried out with a  5-mm Xcel bladeless disposable trocar sleeve with a 5-mm laparoscope.  This was placed in the peritoneal cavity without difficulty.  Pneumoperitoneum is then further created under normal high-pressure flow  with the insufflator.  A 5-mm trocar sleeve  was then removed and a 10/11  has been placed again under direct visualization without difficulty.  A  small suprapubic incision was made in a 5-mm Xcel bladeless disposable  trocar sleeve was placed under direct visualization without difficulty.  The above findings were noted.  Then using the EnSeal bipolar cautery  cutting product the right infundibulopelvic ligament was taken down  coming across the mesosalpinx across the round ligament down the  vesicouterine peritoneum.  Similar procedure is carried out on the left  side.  Vesicouterine peritoneum was taken down cephalad laterally.  Good  hemostasis was noted.  Suprapubic trocar sleeve was removed.  The  instruments are removed.  Attention is turned to the  vagina.  Posterior  cul-de-sacs entered sharply.  Cervix was circumscribed with a scalpel.  Mucosa was advanced sharply and bluntly.  The gyrus bipolar cautery  instrument was then used to take down the uterosacral ligaments followed  by the bladder pillars and the uterine vessels bilaterally.  Progressive  bites were taken and the specimen is delivered.  There is bleeding from  the vaginal cuff which was controlled with interrupted sutures along the  posterior cuff.  The uterosacral ligaments were also plicated vaginal  cuff bilaterally and then plicated in the midline with a third suture.  All suture is 0 Monocryl unless otherwise designated.  Cuff was then  closed with figure-of-eights.  Foley catheter was placed in the bladder  and clear urine is noted.  Attention was returned to the abdomen.  Minor  bleeding at peritoneal edges is controlled with bipolar cautery.  Excess  fluid was removed.  A 5 mL of 1% Xylocaine had been used subcutaneously.  Additional 25 were then placed intraperitoneally.  Suprapubic trocar  sleeve was removed.  Pneumoperitoneum was reduced and good hemostasis  was again noted.  Pneumo was completely reduced and the umbilical trocar  sleeve was removed.  A 0 Vicryl suture is used to reapproximate the  deeper subcutaneous tissues under good visualization.  Skin was closed  with interrupted 3-0 Vicryl mattress sutures on both incisions and  Dermabond was placed on both incisions as well.  All counts were  correct.  The patient is awakened, taken to recovery room in stable  condition.       Guy Sandifer Henderson Cloud, M.D.  Electronically Signed     JET/MEDQ  D:  08/07/2008  T:  08/08/2008  Job:  [id number]

## 2010-08-05 NOTE — Discharge Summary (Signed)
NAME:  [name], [name]              ACCOUNT NO.:  [id number]   MEDICAL RECORD NO.:  [id number]          PATIENT TYPE:  OIB   LOCATION:  9316                          FACILITY:  [facility]   PHYSICIAN:  Guy Sandifer. Henderson Cloud, M.D. DATE OF BIRTH:  1965-10-17   DATE OF ADMISSION:  08/07/2008  DATE OF DISCHARGE:  08/08/2008                               DISCHARGE SUMMARY   ADMITTING DIAGNOSIS:  Endometriosis.   DISCHARGE DIAGNOSIS:  Endometriosis.   PROCEDURE:  On Aug 07, 2008, laparoscopically-assisted vaginal  hysterectomy and bilateral salpingo-oophorectomy.   REASON FOR ADMISSION:  This patient is a 45 year old single white  female, G2, P1 with long-established history of endometriosis.  Details  dictated in the history and physical.  She was admitted for surgical  management.   HOSPITAL COURSE:  The patient has been in the hospital and undergoes the  above procedure.  On the evening of surgery, she had good pain relief.  She was ambulating well.  Vital signs were stable.  She was afebrile  with a clear urine output.  On the day of discharge, she was tolerating  a regular diet, passing flatus, voiding, and ambulating.  Vital signs  were stable.  She remained afebrile and hemoglobin is 11.4.  Pathology  is pending.  Abdomen is soft with good bowel sounds.   CONDITION ON DISCHARGE:  Good.   DIET:  Regular, as tolerated.   ACTIVITY:  No lifting, no operation of automobiles, and no vaginal  entry.  She is to call the office for problems including, not limited to  temperature of 101 degrees, persistent nausea, vomiting, increasing  pain, or heavy vaginal bleeding.   MEDICATIONS:  1. Percocet 5/325 mg #40 one to two p.o. q.6 h. p.r.n.  2. Ibuprofen 600 mg q.6 h. p.r.n.  3. Multivitamin daily.   FOLLOWUP:  She will call the office p.r.n. hot flashes.  Followup is in  the office in 2 weeks.      Guy Sandifer Henderson Cloud, M.D.  Electronically Signed     JET/MEDQ  D:  08/08/2008  T:   08/09/2008  Job:  [id number]

## 2010-08-05 NOTE — H&P (Signed)
NAME:  [name], [name]              ACCOUNT NO.:  [id number]   MEDICAL RECORD NO.:  [id number]          PATIENT TYPE:  INP   LOCATION:  9170                          [facility]ACILITY:  [facility]   PHYSICIAN:  Duke Salvia. Marcelle Overlie, M.D.DATE OF BIRTH:  [birth year]   DATE OF ADMISSION:  08/20/2006  DATE OF DISCHARGE:                              HISTORY & PHYSICAL   CHIEF COMPLAINT:  Headache and edema.   HISTORY OF PRESENT ILLNESS:  This is a 45 year old G2, P0-0-1-1 who is  postoperative cesarean section on Aug 06, 2006.  She noted yesterday  problems with headache and increased edema.  CMET was done today that  showed elevated OT and PT in the 100+ range.  While she was in the  hospital, these have been normal.  She has significant lower extremity  edema.  Blood pressure is 130/80, but she will be admitted for  postpartum preeclampsia.   CURRENT MEDICATIONS:  1. Lasix p.o. yesterday.  2. Baby aspirin daily.  3. Tylox p.r.n. pain.   Blood type is A positive.   PAST MEDICAL HISTORY:  Please see her [facility] form for past medical  history details.   ALLERGIES:  1. Latex.  2. Vinyl.  3. Erythromycin.   PHYSICAL EXAMINATION:  VITAL SIGNS:  Temperature 98.2, blood pressure  130/82.  HEENT:  Unremarkable.  NECK:  Supple without masses.  LUNGS:  Clear.  CARDIOVASCULAR:  Regular rate and rhythm without murmurs, rubs or  gallops.  BREASTS:  Not examined.  ABDOMEN:  Soft, flat and nontender.  She had a seroma underneath the  incision, but no evidence of cellulitis.  EXTREMITIES:  The lower extremities revealed 2+ edema.  Reflexes are 1  to 2+, no clonus.   IMPRESSION:  Postpartum preeclampsia.   PLAN:  We will admit for monitoring, magnesium sulfate 4 gram load and 2  grams per hour, Lasix 20 mg IV x1.      Richard M. Marcelle Overlie, M.D.  Electronically Signed     RMH/MEDQ  D:  08/20/2006  T:  08/20/2006  Job:  [id number]

## 2010-08-07 ENCOUNTER — Ambulatory Visit (INDEPENDENT_AMBULATORY_CARE_PROVIDER_SITE_OTHER): Payer: 59 | Admitting: Family Medicine

## 2010-08-07 ENCOUNTER — Encounter: Payer: Self-pay | Admitting: Family Medicine

## 2010-08-07 VITALS — BP 110/78 | Temp 100.7°F | Wt 254.0 lb

## 2010-08-07 DIAGNOSIS — R509 Fever, unspecified: Secondary | ICD-10-CM

## 2010-08-07 LAB — POCT URINALYSIS DIPSTICK
Bilirubin, UA: NEGATIVE
Blood, UA: NEGATIVE
Nitrite, UA: NEGATIVE
Protein, UA: NEGATIVE
pH, UA: 6

## 2010-08-07 LAB — CBC WITH DIFFERENTIAL/PLATELET
Basophils Absolute: 0 10*3/uL (ref 0.0–0.1)
HCT: 34.1 % — ABNORMAL LOW (ref 36.0–46.0)
Hemoglobin: 11.8 g/dL — ABNORMAL LOW (ref 12.0–15.0)
Lymphs Abs: 1 10*3/uL (ref 0.7–4.0)
MCHC: 34.6 g/dL (ref 30.0–36.0)
MCV: 93.4 fl (ref 78.0–100.0)
Monocytes Absolute: 0.4 10*3/uL (ref 0.1–1.0)
Monocytes Relative: 12.9 % — ABNORMAL HIGH (ref 3.0–12.0)
Neutro Abs: 1.7 10*3/uL (ref 1.4–7.7)
RDW: 13.6 % (ref 11.5–14.6)

## 2010-08-07 NOTE — Patient Instructions (Signed)
Follow up promptly for any increasing fever, worsening headache, or persistent fever.

## 2010-08-07 NOTE — Progress Notes (Signed)
  Subjective:    Patient ID: [name], female    DOB: 23-Jan-1966, 45 y.o.   MRN: [id number]  HPI Patient seen with onset of illness around Sunday about 4 days ago. Predominant symptoms are fatigue, body aches, low-grade fever, and headache. Bodyache subjective somewhat improved at this time.  Pt had some headaches earlier in week she thought were migraine and took Relpax without improvement. No tick bites. Some bilateral ear pressure. She denies any cough, abdominal pain, dysuria, nasal congestion, diarrhea, or skin rashes. She had mild sore throat. Occasional mild sweats. Nonspecific arthralgias but no evidence for inflammatory arthritis  She does have some bifrontal headaches which are relatively constant and moderate severity. No recent travels. No recent change in medication.   Review of Systems  Constitutional: Positive for fever, chills, diaphoresis and fatigue. Negative for appetite change and unexpected weight change.  HENT: Negative for ear pain, neck stiffness and ear discharge.   Respiratory: Negative for cough, shortness of breath and wheezing.   Cardiovascular: Negative for chest pain, palpitations and leg swelling.  Gastrointestinal: Negative for abdominal pain and diarrhea.  Genitourinary: Negative for dysuria, hematuria and flank pain.  Skin: Negative for rash.  Neurological: Positive for headaches. Negative for dizziness.  Hematological: Negative for adenopathy. Does not bruise/bleed easily.       Objective:   Physical Exam  Constitutional: She is oriented to person, place, and time. She appears well-developed and well-nourished. No distress.  HENT:  Right Ear: External ear normal.  Left Ear: External ear normal.  Mouth/Throat: Oropharynx is clear and moist. No oropharyngeal exudate.  Neck: Neck supple. No thyromegaly present.  Cardiovascular: Normal rate, regular rhythm and normal heart sounds.   No murmur heard. Pulmonary/Chest: Effort normal and breath  sounds normal. No respiratory distress. She has no wheezes. She has no rales.  Abdominal: Soft. Bowel sounds are normal. She exhibits no distension and no mass. There is no tenderness. There is no rebound and no guarding.  Musculoskeletal: She exhibits no edema.  Lymphadenopathy:    She has no cervical adenopathy.  Neurological: She is alert and oriented to person, place, and time.  Skin: No rash noted.          Assessment & Plan:  Febrile illness. Question viral. Nonfocal exam. No evidence for pneumonia. Check urinalysis. Consider CBC.

## 2010-08-08 NOTE — Op Note (Signed)
NAME:  [name], [name]              ACCOUNT NO.:  [id number]   MEDICAL RECORD NO.:  [id number]          PATIENT TYPE:  AMB   LOCATION:  SDC                           FACILITY:  [facility]   PHYSICIAN:  Guy Sandifer. Henderson Cloud, M.D. DATE OF BIRTH:  25-Jan-1966   DATE OF PROCEDURE:  02/05/2005  DATE OF DISCHARGE:                                 OPERATIVE REPORT   PREOPERATIVE DIAGNOSES:  1.  Right lower quadrant pain.  2.  Spontaneous abortion.   POSTOPERATIVE DIAGNOSES:  1.  Right lower quadrant pain.  2.  Spontaneous abortion.   PROCEDURES:  1.  Laparoscopy.  2.  Dilatation and evacuation.  3.  1% Xylocaine paracervical block.   SURGEON:  Guy Sandifer. Henderson Cloud, M.D.   ANESTHESIA:  General with endotracheal intubation.   ESTIMATED BLOOD LOSS:  Drops.   SPECIMENS:  Endometrial curettings.   INDICATIONS AND CONSENT:  This patient is a 45 year old white female G1, P0,  who has a history of pelvic pain and endometriosis.  She has had right lower  quadrant pain that has been increasing for a number of weeks.  She also  conceived via artificial insemination and has recently been diagnosed with a  spontaneous abortion.  The exact location of the pregnancy was not  definitively proven.  She was treated with methotrexate, has falling  quantitative HCGs.  In view of her continued right lower quadrant pain,  laparoscopy, dilatation and evacuation was discussed.  Potential risks and  complications are reviewed preoperatively including but limited to  infection, uterine perforation, bowel, bladder or ureteral damage, bleeding  requiring transfusion of blood products with possible transfusion reaction,  HIV and hepatitis acquisition, DVT, PE, pneumonia.  All questions were  answered and consent signed on the chart.   FINDINGS:  Upper abdomen is grossly normal.  The appendix is normal.  Anterior and posterior cul-de-sacs are normal.  Tubes, ovaries are normal  bilaterally.  Uterus is normal in size and  contour.   PROCEDURE:  The patient is taken to the operating room, where she is  identified, placed in the dorsal supine position, and general anesthesia is  induced via endotracheal intubation.  She is then placed in the dorsal  lithotomy position, where she is prepped abdominally and vaginally, t  bladder is straight catheterized, and she is draped in sterile fashion.  This is all done with latex-free instrumentation.  Bivalve speculum is then  placed in the vagina and the anterior cervical lip is injected with 1%  Xylocaine and grasped with a single-tooth tenaculum.  A paracervical block  is placed at 2, 4, 5, 7, 8 and 10 o'clock positions with approximately 20 mL  total of 1% plain Xylocaine.  The cervix is gently progressively dilated to  a 23 Pratt dilator.  Sharp curettage is carried out for a minimal amount of  tissue.  The single-tooth tenaculum is replaced with a Hulka tenaculum and  attention is turned to the abdomen.  A small infraumbilical incision is made  and a disposable Veress needle is placed intra-abdominally with a normal  syringe and drop test on the  first attempt.  Three liters of gas are then  insufflated under low pressure with good tympany in the right upper  quadrant.  The Veress needle is removed, then using the 10/11 disposable  bladeless XL trocar sleeve and the straight diagnostic scope for direct  visualization, the trocar sleeve is placed in the peritoneal cavity.  The  diagnostic scope is then replaced with the operative scope and careful  inspection reveals no damage to surrounding tissues.  A small suprapubic  incision is made and a 5 mm disposable bladeless XL trocar sleeve is placed  under direct visualization without difficulty.  The above findings are  noted.  There is some bleeding from the subcutaneous tissues around the  umbilical trocar sleeve.  Therefore irrigation is carried out to rinse the  peritoneal cavity.  Excess fluid is removed.  The  suprapubic trocar sleeve  is removed.  Pneumoperitoneum is completely reduced and umbilical trocar  sleeve is removed.  Vicryl 2-0 suture is used to reapproximate the  subcutaneous tissue and the skin in the umbilical incision.  This is done  well above the layer of the fascia.  Both incisions are injected with 0.5%  plain Marcaine and Dermabond is applied to both skin incisions.  The Hulka  tenaculum was removed and good hemostasis is noted.  All counts are correct.  The patient is awakened, taken to recovery room in stable condition.      Guy Sandifer Henderson Cloud, M.D.  Electronically Signed     JET/MEDQ  D:  02/05/2005  T:  02/05/2005  Job:  [id number]

## 2010-08-08 NOTE — Op Note (Signed)
NAME:  [name], [name]              ACCOUNT NO.:  [id number]   MEDICAL RECORD NO.:  [id number]          PATIENT TYPE:  AMB   LOCATION:  SDC                           FACILITY:  [facility]   PHYSICIAN:  Sharlet Salina T. Hoxworth, M.D.DATE OF BIRTH:  [birth year]   DATE OF PROCEDURE:  06/23/2004  DATE OF DISCHARGE:  06/23/2004                                 OPERATIVE REPORT   PREOPERATIVE DIAGNOSIS:  Status post laparoscopic Roux-Y gastric bypass.   POSTOPERATIVE DIAGNOSIS:  Status post laparoscopic Roux-Y gastric bypass.   HISTORY OF PRESENT ILLNESS:  [name] is undergoing a laparoscopy by  Dr. Henderson Cloud for pelvic pain and possible endometriosis.  I discussed with  the patient with her history of laparoscopic Roux-Y gastric bypass I would  like to examine her bowel during the procedure to look for any internal  hernias or abnormalities.  She generally has been doing very well with just  some occasional lower abdominal cramping and gas consistent with irritable  bowel that she had prior to and since her gastric bypass.   DESCRIPTION OF PROCEDURE:  At the completion of Dr. Huel Coventry procedure, I  did place an additional 5 mm trocar in the left flank to allow for  manipulation of the bowel.  The Roux limb was identified and the  gastrojejunostomy could be visualized.  The Roux limb was traced distally  down to the jejunojejunostomy and appeared completely normal.  Looking  behind the jejunojejunostomy, there was no hernia beneath it or the afferent  limb at the site of this previous mesenteric defect closure.  Examining the  afferent limb back proximally, it was seen that about half of the afferent  limb was beneath the Roux limb on the patient's left side through Petersen's  defect.  This was easily reduced back through Petersen's defect but did not  appear to be a tight space and the afferent limb, although it had been  through this defect, did not appear dilated or abnormal in any way.  The  common channel was then traced distally and also appeared normal.  As the  patient was asymptomatic and this space did not appear constricting, I  elected not to intervene at this point.  Also, angled scopes, needle  holders, etc. were not available at [facility] at any rate.  The  procedure was then completed by Dr. Henderson Cloud.      BTH/MEDQ  D:  06/25/2004  T:  06/25/2004  Job:  [id number]

## 2010-08-08 NOTE — Op Note (Signed)
NAME:  [name], [name]              ACCOUNT NO.:  [id number]   MEDICAL RECORD NO.:  [id number]          PATIENT TYPE:  INP   LOCATION:  0153                         FACILITY:  [facility]   PHYSICIAN:  Sharlet Salina T. Hoxworth, M.D.DATE OF BIRTH:  Dec 15, 1965   DATE OF PROCEDURE:  01/01/2004  DATE OF DISCHARGE:                                 OPERATIVE REPORT   PREOPERATIVE DIAGNOSES:  Morbid obesity.   POSTOPERATIVE DIAGNOSES:  Morbid obesity.   PROCEDURE:  Laparoscopic roux-Y gastric bypass.   SURGEON:  Lorne Skeens. Hoxworth, M.D.   ASSISTANT:  Thornton Park. Daphine Deutscher, M.D.   ANESTHESIA:  General.   BRIEF HISTORY:  [name] is a 45 year old female with a long history  of morbid obesity unresponsive to multiple attempts at medically __________  and unsupervised diets. She has developed multiple comorbidities including  hypertension, sleep apnea, arthritis, stress urinary incontinence, GERD,  insulin resistance. After extensive consultation and workup detailed  elsewhere, we have elected to proceed with laparoscopic roux-Y gastric  bypass for correction of her morbid obesity. The procedure has been  discussed in detail and risks extensively discussed separately detailed.   DESCRIPTION OF PROCEDURE:  The patient was brought to the operating room,  placed in supine position on the operating table and general endotracheal  anesthesia was induced.  She received preoperative antibiotics. A Foley  catheter was placed. The abdomen was widely sterilely prepped and draped.  Local anesthesia was used to infiltrate the trocar sites. Access was  obtained with a 12 mm Optiview trocar in the left subcostal space mid  clavicular line without difficulty and pneumoperitoneum established. Under  direct vision, the 12 mm trocar was placed in the right upper quadrant  through the falciform ligament. The right mid abdomen, the left mid abdomen  and a 5 mm trocar in the left flank.  The mesentery and  transverse colon  were elevated and ligament of Treitz clearly identified. A 40 cm afferent  limb was measured and then we went about 5 cm distal to this where the  mesentery was a little more mobile and seemed to reach easily up over the  transverse colon toward the edge of the liver. At this point, the bowel was  divided with a single firing of the EndoGIA 45 mm white load stapler. The  mesentery was further mobilized dividing down to the first arcade with the  harmonic scalpel.  Both bowel ends were well perfused.  The end of the  efferent limb was marked with a piece of suture IV tubing and a 100 cm  efferent limb was then carefully measured. At this point, an anastomosis was  created between the end of the afferent limb and the side of the efferent  limb. This was accomplished with enterotomies using the harmonic scalpel and  a single firing of the EndoGIA 35 mm white load stapler.  The staple line  was inspected and was intact without bleeding. The common enterotomy was  then closed with running 2-0 Vicryl beginning on either end of the  enterotomy and tied centrally.  Following this, the mesenteric defect behind  the jejunojejunostomy was closed with a running 2-0 silk carried up onto the  jejunojejunostomy. The suture and staple lines in the jejunojejunostomy were  then treated with Tisseel tissue sealant.  At this point, attention was  turned to the upper abdomen. Through a 5 mm site in the epigastrium, the  [facility] liver retractor was placed and the blood flow of the liver  elevated with good exposure of the hiatus and upper stomach. The angle of  His was mobilized using the harmonic scalpel and dissection carried down  along the left crus toward the lesser sac with careful blunt dissection.  After this, a point along the lesser curve was identified approximately 4-5  cm from the EG junction for division of the gastric pouch. The peritoneum  along the lesser curve was incised,  dissection was carried carefully along  the posterior stomach up toward the lesser sac with the harmonic scalpel and  blunt dissection at right angles to the lesser curve.  This was dissected  for several centimeters and then an initial firing of the EndoGIA 45 mm blue  load stapler was performed at right angles to the lesser curve. Further  dissection posterior to the pouch entered the free lesser sac up toward the  angle of His.  The blue load EndoGIA 60 mm stapler was then used to create a  pouch directly up toward the angle of His and the stomach was seen to be  completely divided at this point with an Ewald tube in place with no  impingement on the EG junction. The stapler line of the remnant was then  oversewn with running 2-0 Vicryl along its length.  The efferent limb was  then brought up in an antecolic position and was seen to reach without undue  tension to the gastric pouch. The gastrojejunostomy was then created with an  initial posterior row of running 2-0 Vicryl between the efferent limb and  the staple line of the gastric pouch.  Enterotomies were then made in the  pouch and the efferent limb with the harmonic scalpel and an approximately 2  to 2 1/2 cm anastomosis was created with a single firing of the EndoGIA  white load stapler.  The staple line at the anastomosis was inspected and  appeared intact without bleeding.  The common enterotomy was then closed in  two layers initially with a running 2-0 Vicryl full thickness suture  beginning at either end of the enterotomy and tied centrally.  The Ewald  tube was then passed down through the anastomosis and an outer layer of  serial muscular running 2-0 Vicryl was placed anteriorly.  Following this,  the Ewald tube was removed.  With the efferent limb clamped near the  gastrojejunostomy, the patient was endoscoped by Dr. Ezzard Standing and with tight  insufflation of the pouch under saline there was no evidence of any air leak.  All  air was suctioned. Tisseel sealant was then placed over the  staple and suture lines of the gastrojejunostomy.  A closed suction drain  was left near the anastomosis and brought out through the lateral trocar  wound. Trocars were then removed and all CO2 evacuated. The skin incisions  were closed with staples. Sponge, needle and instrument counts were correct.  Dry sterile dressings were applied and the patient was taken to the in  satisfactory condition.      BTH/MEDQ  D:  01/01/2004  T:  01/01/2004  Job:  [id number]

## 2010-08-08 NOTE — Op Note (Signed)
NAME:  [name], [name]              ACCOUNT NO.:  [id number]   MEDICAL RECORD NO.:  [id number]          PATIENT TYPE:  AMB   LOCATION:  ENDO                         FACILITY:  [facility]   PHYSICIAN:  Sandria Bales. Ezzard Standing, M.D.  DATE OF BIRTH:  [birth year]   DATE OF PROCEDURE:  08/07/2005  DATE OF DISCHARGE:                                 OPERATIVE REPORT   PREOPERATIVE DIAGNOSIS:  Abdominal pain, right upper quadrant with nausea,  status post Roux-en-Y gastric bypass.   POSTOPERATIVE DIAGNOSIS:  Normal gastric pouch with normal gastric-jejunal  anastomosis.   SURGEON:  Sandria Bales. Ezzard Standing, M.D.   ANESTHESIA:  Demerol 50 mg, Versed 7 mg.   COMPLICATIONS:  None.   INDICATIONS FOR PROCEDURE:  [name] is a 45 year old white female who  is a patient of Dr. Sharlet Salina T. Hoxworth's, who underwent a laparoscopic  Roux-En-Y gastric bypass in October 2005.  Her initial weight was 395  pounds.  She has successfully lost down to 239 pounds, for an approximately  150 pound weight loss; however, over the last three to four months, she has  had increasing epigastric to right upper quadrant pain, sometimes associated  with nausea.  She has had a prior cholecystectomy and now comes for an upper  endoscopy to rule out ulcer disease, etc.   Of note, the patient had a prior endoscopy a number of years ago and is  terrified of being endoscoped.  Apparently she had a very bad experience and  considers herself to be very sensitive to vomiting, etc.   The indications and potential complications were explained to the patient.  The potential complications include but are not limited to bleeding and  perforation.   DESCRIPTION OF PROCEDURE:  The back of her esophagus and throat were  anesthetized with Cetacaine.  She was monitored with pulse oximetry,  electrocardiogram and blood pressure cuff, and placed on 2 liters of nasal  O2 during the procedure.  After the back of the throat was anesthetized, she  was  rolled to her left side and given 50 mg of Demerol and 7 mg of Versed.   She was kept comfortable during the procedure.  I was able to pass an  Olympus endoscope without difficulty into her gastric pouch and into her  jejunum.  I was able to go down about 15-20 cm down her Roux limb over  jejunum and identify her gastrojejunal anastomosis at 45 cm from the  incisors.  Her esophagogastric junction was approximately 39-40 cm from her  incisors, for a 5 to 6 cm pouch which appeared of normal size.  There was no  ulceration or inflammation of either jejunum or the stomach.  I did biopsy  the stomach wall for Clostridium, but she really had no evidence of any  gastritis or anything, and it was really an unremarkable test.   The scope was withdrawn to the esophagus and pulled back.  Again her  esophagus was entirely normal.  Her vocal cords were actually visualized.  They were also normal.   DISPOSITION:  She will be in touch with Dr. Johna Sheriff for  further evaluation.      Sandria Bales. Ezzard Standing, M.D.  Electronically Signed     DHN/MEDQ  D:  08/07/2005  T:  08/07/2005  Job:  [id number]   cc:   Lorne Skeens. Hoxworth, M.D.  [address], [address]  [place]  [place] [address]

## 2010-08-08 NOTE — H&P (Signed)
NAME:  Yadav, Melayah A                        ACCOUNT NO.:  [id number]   MEDICAL RECORD NO.:  [id number]                   PATIENT TYPE:  AMB   LOCATION:  SDC                                  FACILITY:  [facility]   PHYSICIAN:  Guy Sandifer. Arleta Creek, M.D.           DATE OF BIRTH:  19-Jun-1965   DATE OF ADMISSION:  07/20/2003  DATE OF DISCHARGE:                                HISTORY & PHYSICAL   CHIEF COMPLAINT:  Endometrial polyp.   HISTORY OF PRESENT ILLNESS:  This patient is a 45 year old single white  female, gravida 0, para 0, who had a compromised pelvic examination due to  habitus.  Ultrasound in my office on June 01, 2003, to evaluate the pelvis  was suspicious for an endometrial mass.  Sonohysterogram was consistent with  a 1.2 cm polypoid type mass in the endometrial cavity.  The ovaries were  essentially normal.  The patient has a history of polycystic ovarian  syndrome and irregular menses.  Options of management were discussed and the  patient is being admitted for hysteroscopy D&C.  Potential risks and  complications have been discussed with the patient preoperatively.   PAST MEDICAL HISTORY:  1. Sleep apnea.  2. Chronic hypertension.  3. Type 2 diabetes.  4. Reactive airway disease.  5. Endometriosis.  6. Polycystic ovarian syndrome.  7. Plantar fasciitis.  8. Question of connective tissue disease.  9. Partially torn left Achilles tendon.  10.      Stress fractures of the right foot.   PAST SURGICAL HISTORY:  Exploratory laparotomy in 1992.  Laparoscopic  cholecystectomy with laser ablation of endometriosis in 1995.  Wisdom teeth  extraction in 1989.   MEDICATIONS:  1. Celebrex 200 mg daily.  2. Flexeril 5 mg p.r.n.  3. Lisinopril 10 mg daily.  4. Advair 50/150 b.i.d.   ALLERGIES:  ERYTHROMYCIN leading to nausea and vomiting, AUGMENTIN leading  to nausea and vomiting, LATEX sensitivity.   PAST SURGICAL HISTORY:  Negative.   FAMILY HISTORY:  Positive for peptic  ulcer disease in father. Colon polyps  in grandmother and uncle.   SOCIAL HISTORY:  The patient denies tobacco, alcohol, or drug abuse.   REVIEW OF SYSTEMS:  NEUROLOGY:  Denies recent headache.  CARDIO:  Denies  chest pain.  PULMONARY:  Reactive airway disease as above.  ABDOMEN:  Some  abdominal pain on and off.  History of endometriosis as above.  Abdominopelvic CAT scan on June 08, 2003, was essentially normal with no  evidence of kidney stone and chest x-ray on June 08, 2003, was within  normal limits.   PHYSICAL EXAMINATION:  VITAL SIGNS:  Height 5 feet 10-1/2 inches, weight 385  pounds, blood pressure 118/68.  HEENT:  Without thyromegaly.  LUNGS: Clear to auscultation.  HEART:  Regular rate and rhythm.  BACK:  Without CVA tenderness.  BREASTS:  Pendulous without palpable mass, retraction, or discharge.  ABDOMEN:  Morbidly obese, soft,  and nontender without palpable masses.  PELVIC:  Essentially totally compromised.  EXTREMITIES:  Grossly within normal limits.   ASSESSMENT:  Endometrial polyp per sonohysterogram.   PLAN:  Hysteroscopy with D&C.                                               Guy Sandifer Arleta Creek, M.D.    JET/MEDQ  D:  07/19/2003  T:  07/19/2003  Job:  [id number]

## 2010-08-08 NOTE — Discharge Summary (Signed)
NAME:  [name], [name]              ACCOUNT NO.:  [id number]   MEDICAL RECORD NO.:  [id number]          PATIENT TYPE:  INP   LOCATION:  9161                          FACILITY:  [facility]   PHYSICIAN:  Duke Salvia. Marcelle Overlie, M.D.DATE OF BIRTH:  07/01/1965   DATE OF ADMISSION:  08/20/2006  DATE OF DISCHARGE:  08/22/2006                               DISCHARGE SUMMARY   ADMITTING DIAGNOSES:  1. Postpartum preeclampsia.  2. Two 2 weeks status post cesarean delivery.   DISCHARGE DIAGNOSIS:  Postpartum preeclampsia, resolved.   REASON FOR ADMISSION:  Please see dictated H&P.   HISTORY OF PRESENT ILLNESS:  The patient is a 45 year old, G2, P0-0-1-1  who is 2 weeks postoperative cesarean delivery admitted with complaints  of headache and increased edema.  CMET had been done in the office which  revealed an elevation of OT and PT in the 100+ range.  The patient was  now admitted for observation and magnesium sulfate.   PHYSICAL EXAMINATION:  VITAL SIGNS:  On admission, temperature was 98.2,  blood pressure 130/82.  HEART:  Regular rate and rhythm without murmur, rubs or gallops.  ABDOMEN:  Soft.  There was a small seroma that was noted inferior to the  incision, but no evidence of cellulitis.  EXTREMITIES:  Deep tendon reflexes 1-2+, no clonus with 2+ pedal edema.   HOSPITAL COURSE:  The patient was now admitted for monitoring and  magnesium sulfate 4 g load with a 2 g per hour rate.  Also, Lasix 20 mg  IV was given.  On the following morning, the patient stated that  headache was improved.  Vital signs were stable with blood pressure  130/87, 2+ lower extremity edema was continued.  Incision was clean and  dry.  Laboratory findings revealed hemoglobin of 10.9, platelet count of  281,000.  SGOT was 58, SGPT was 93.  Potassium level was noted to be  2.7.  The patient was restarted on Cardizem and Zoloft and potassium was  added to IV fluids.  The patient continued on magnesium sulfate with  labs  ordered for the following morning.   On the following morning, the patient was feeling better.  She denied  any headache or visual disturbance.  Blood pressure was 130/78.  Incision was clean, dry and intact.  Potassium was 3.5.  SGOT was 41 and  SGPT was 72.  Due to significant improvement of laboratory findings and  resolution of symptoms, the patient was discharged home.   CONDITION ON DISCHARGE:  Stable.   DIET:  Regular as tolerated.   ACTIVITY:  Up as tolerated.   SPECIAL INSTRUCTIONS:  She is to call for headache, blurred vision or  right upper quadrant pain.   FOLLOW UP:  The patient to follow up in the office in 2 days for blood  pressure check and laboratory findings.   DISCHARGE MEDICATIONS:  1. Hydrochlorothiazide 50 mg one p.o. daily.  2. Prenatal vitamins.  3. Percocet as needed.  4. Cardizem LA 240 mg daily.  5. Zoloft 100 mg per day.      Julio Sicks, N.P.  Richard M. Marcelle Overlie, M.D.  Electronically Signed    CC/MEDQ  D:  09/27/2006  T:  09/27/2006  Job:  [id number]

## 2010-08-08 NOTE — Op Note (Signed)
NAME:  [name], [name]                        ACCOUNT NO.:  [id number]   MEDICAL RECORD NO.:  [id number]                   PATIENT TYPE:  AMB   LOCATION:  SDC                                  FACILITY:  [facility]   PHYSICIAN:  Guy Sandifer. Arleta Creek, M.D.           DATE OF BIRTH:  [birth year]   DATE OF PROCEDURE:  07/20/2003  DATE OF DISCHARGE:                                 OPERATIVE REPORT   PREOPERATIVE DIAGNOSIS:  Endometrial polyp.   POSTOPERATIVE DIAGNOSIS:  Endometrial polyp.   PROCEDURE:  Hysteroscopy with resection of endometrial polyp and dilatation  and curettage.   SURGEON:  Guy Sandifer. Henderson Cloud, M.D.   ANESTHESIA:  1. Spinal, Belva Agee, M.D.  2. Marcaine 0.25% paracervical block.   ESTIMATED BLOOD LOSS:  Drops.   SPECIMENS:  1. Endometrial biopsy, right.  2. Endometrial biopsy, left.  3. Endometrial curettings.   INTAKE AND OUTPUT OF SORBITOL DISTENDING MEDIUM:  100 mL deficit.   INDICATIONS AND CONSENT:  This patient is [name] 45 year old single white female,  G0, P0, noted to have an endometrial polyp on ultrasound.  Details are  dictated in the history and physical.  Hysteroscopy with resectoscope,  dilatation and curettage has been discussed with the patient preoperatively.  Potential risks and complications are reviewed, including but not limited to  infection, uterine perforation, and bowel, bladder, or ureteral damage,  bleeding requiring transfusion of blood products with possible transfusion  reaction, HIV and hepatitis acquisition, DVT, PE, pneumonia, hysterectomy,  recurrent polyps, or abnormal bleeding.  All questions have been answered,  and consent is signed on the chart.   FINDINGS:  There is an approximately 1.5 cm pedunculated polyp in the right  upper endometrial canal.  There is [name] second 0.5 cm smooth bump in the  endometrium in the left upper endometrial canal.  Fallopian tube ostia are  identified bilaterally.   PROCEDURE:  The patient is taken  to the operating room, where she is  identified, spinal anesthetic is placed without difficulty, and she is  placed in the dorsal supine position.  She is then placed in the dorsal  lithotomy position, where she is prepped, the bladder is straight-  catheterized, and she is draped in the sterile fashion.  [name] bivalve speculum  is placed, and the anterior cervical lip is injected with 0.25% plain  Marcaine and grasped with [name] single-tooth tenaculum.  [name] paracervical block is  then placed at the 2, 4, 5, 7, 8, and 10 o'clock positions with  approximately 20 mL total of 0.25% plain Marcaine.  The cervix is then  gently progressively dilated to [name] 33 Pratt dilator.  The hysteroscopic  resectoscope with the single right angle wire loop is then placed in the  endocervical canal and advanced under direct visualization using sorbitol  distending media without difficulty.  The above findings are noted.  The  polyp on the right side is resected and delivered  and the biopsy of the  second suspicious area on the left of the endometrial canal is then biopsied  and then sent to pathology.  Sharp curettage is then carried out.  Instruments are removed.  Good hemostasis is noted.  All counts are correct.  The patient is transferred to the recovery room in stable condition.  The  patient is known to have sleep apnea.  She will be observed closely in the  recovery room.                                               Guy Sandifer Arleta Creek, M.D.    JET/MEDQ  D:  07/20/2003  T:  07/20/2003  Job:  [id number]

## 2010-08-08 NOTE — Procedures (Signed)
NAME:  [name], [name]              ACCOUNT NO.:  [id number]   MEDICAL RECORD NO.:  [id number]          PATIENT TYPE:  OUT   LOCATION:  SLEEP CENTER                 [facility]ACILITY[facility]  [facility]   PHYSICIAN:  Marcelyn Bruins, M.D. [facility] DATE OF BIRTH:  04-10-1965   DATE OF STUDY:  10/20/2004                              NOCTURNAL POLYSOMNOGRAM   REFERRING PHYSICIAN:  Dr. Shan Levans   DATE OF STUDY:  October 20, 2004   INDICATION FOR THE STUDY:  Hypersomnia with sleep apnea. The patient has  been treated with C-PAP in the past and has undergone bariatric surgery and  lost quite a bit of weight. She is returning for re-titration. Epworth  score: 5.   SLEEP ARCHITECTURE:  The patient had a total sleep time of 305 minutes with  adequate slow wave sleep but very decreased REM. Sleep onset latency was  mildly prolonged at 32-and-a-half minutes, and REM onset was very prolonged  at 318 minutes. Sleep efficiency was only 71%.   IMPRESSION:  1.  Good control of previously-diagnosed obstructive sleep apnea with 7 cm      of C-PAP. The patient did, however, have some breakthrough snoring which      required an increase in pressure to 8 cm. The patient used her gel mask      from home with her own head gear.  2.  No clinically significant cardiac arrhythmias.  3.  Large numbers of leg jerks with only mild sleep disruption. Clinical      correlation is suggested if she continues to be seen symptomatic even      after appropriate treatment C-PAP.     ______________________________  Suzzette Righter    KC/MEDQ  D:  10/31/2004 12:18:25  T:  10/31/2004 15:53:30  Job:  [id number]

## 2010-08-08 NOTE — Progress Notes (Signed)
Quick Note:  Pt informed ______ 

## 2010-08-08 NOTE — Assessment & Plan Note (Signed)
[facility]                             PULMONARY OFFICE NOTE   NAME:Blea, DONNICA JARNAGIN                     MRN:          [id number]  DATE:02/24/2006                            DOB:          [birth year]    Ms. Wilds is a 45 year old white female, history of reflux, previous  mild intermittent asthma, chronic sinus disease, rhinitis.  She comes in  today with increased postnasal drainage, increased productive cough of  clear mucus.  We gave her a course of Zithromax last week.  She is still  coughing.  She maintains Nasonex two sprays each nostril daily,  albuterol p.r.n., Advair 100/50 one spray b.i.d.   EXAMINATION:  Temperature is 98, blood pressure 110/74, pulse 75,  saturation 98% in room air.  Chest showed to be clear.  There was  evidence of pseudowheeze.  Nares showed mild nasal inflammation.   IMPRESSION:  That of allergic rhinitis, early sinusitis, reflux disease  and no evidence of asthma flare.   PLAN:  Begin amoxicillin 250 mg three times a day for 7 days, BroveX 5  mL h.s. between 5 and 7 days, increase Prilosec to 20 mg b.i.d., use  Tessalon Perles p.r.n. cough, increase Nasonex to two sprays b.i.d. each  nostril.  Return this patient to a visit in 3 weeks.     Charlcie Cradle Delford Field, MD, [facility]  Electronically Signed    PEW/MedQ  DD: 02/24/2006  DT: 02/25/2006  Job #: [id number]

## 2010-08-08 NOTE — Consult Note (Signed)
NAME:  [name], [name]              ACCOUNT NO.:  [id number]   MEDICAL RECORD NO.:  [id number]          PATIENT TYPE:  OUT   LOCATION:  GYN                          FACILITY:  [facility]   PHYSICIAN:  Paola A. Duard Brady, MD    DATE OF BIRTH:  06-May-1965   DATE OF CONSULTATION:  09/09/2004  DATE OF DISCHARGE:                                   CONSULTATION   REFERRING PHYSICIAN:  Guy Sandifer. Henderson Cloud, M.D.   The patient seen today in consultation at the request of Dr. Henderson Cloud. Ms.  [name] is a 45 year old gravida 0 who comes in for evaluation and follow-  up of multiple issues. Most recently, she underwent a laparotomy with  ablation of endometriosis, aspiration of a right ovarian cyst, hysteroscopy  with D&C, on June 23, 2004. Findings at the time included a normal uterine  cavity with normal fallopian tubes. There was single powder-burn type  implant of endometriosis on the left pelvic sidewall. The right ovary had a  3 cm translucent cyst. The cul-de-sacs were normal. The cyst was aspirated  and the cyst fluid was sent which revealed rare atypical cells in a  background of granular debris. Reading the pathology report it was  consistent with luteinized follicular cells. The patient wished to be seen  by an oncologist as she has a family history of cancer and wanted to be  proactive regarding her health maintenance. Her last cycle was a week ago.  She states her periods have been normal for the past 4 years. She has had  breakthrough bleeding in the past, most recently around her endometrial  biopsy, but prior to that it has been a while. She is currently not sexually  active. She does complain of irritable bowel syndrome. She also complains of  rectal bleeding around the time of her menses. She had a colonoscopy by  report anywhere from 4-8 years ago. It was not done during her menses and  there were no changes noted. The bleeding is intermittent. She has had pain.  While her pain subsided  and she did well after the surgery, for the past 3  weeks she has had significant increased right-sided pain. She experienced  pain with her period, prior to her period, and after her period. She is  being followed by Dr. Henderson Cloud and will be returning to see him in July for  evaluation of pain as well as per her report for repeat ultrasound. She has  a significant past medical history including depression, sleep apnea,  chronic hypertension, reactive airway disease, insulin resistance,  endometriosis, polycystic ovarian syndrome, plantar fasciitis, irritable  bowel syndrome, history of partially torn left Achilles tendon, history of a  stress fracture on the right foot. She states she turns blue and has  arrhythmias, PACs, PATs, and questionable atrial fibrillation.   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 She has  lost 140 pounds. Hysteroscopy D&C in April 2006. Hysteroscopy D&C,  diagnostic laparoscopy April 2005. Exploratory laparoscopy in 1992 for  endometriosis and pelvic pain. Laparoscopic cholecystectomy in 1995. Wisdom  tooth extraction in 1989.  MEDICATIONS:  1.  Prozac 20 mg p.o. daily.  2.  Advair 50/100 mg b.i.d.  3.  Lisinopril 5 mg daily.  4.  Cardizem 240 mg daily.  5.  Prevacid 30 mg daily.  6.  Calcium.  7.  Vitamins.  8.  Vitamin B.   ALLERGIES:  1.  LATEX.  2.  ERYTHROMYCIN.  3.  AUGMENTIN.   Erythromycin and Augmentin both lead to nausea and vomiting.   SOCIAL HISTORY:  She denies use of tobacco or alcohol. She is single. 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   FAMILY HISTORY:  Her father had basal cell and squamous cell carcinomas of  the skin in sun-exposed areas. Paternal grandmother had endometrial cancer.  She was obese. She also had basal cell carcinoma in a sun exposed area.  Paternal aunt had melanoma and breast cancer in her 42s. Paternal aunt with  basal cell cancer in a sun-exposed area.  Paternal grandfather with a  preleukemic condition.   PHYSICAL EXAMINATION:  VITAL SIGNS:  Height 5 feet 10.5 inches, weight 256  pounds, pulse 64, blood pressure 118/70, respirations 16.  GENERAL:  Well-nourished, well-developed female in no acute distress. Exam  was deferred.   ASSESSMENT:  A 45 year old with history of polycystic ovarian syndrome,  pelvic pain, and endometriosis who wanted to come in today for discussion of  gynecologic cancer risk stratification and recommendations from our  standpoint. She had a cyst aspirated in April 2006 that revealed rare  atypical cells in a background of granular debris which is thought to be  consistent with luteinized follicular cells. There is no nuclear atypia  noted.   PLAN:  1.  I do not think that the pathology from the cytology is consistent with a      malignant state of the ovary. I have a call in to Dr. Quentin Ore to discuss      this personally with her and I do not feel that this per se needs any      particular action. The patient does have multiple other gynecologic      issues and she has a follow-up appointment with Dr. Henderson Cloud. The patient      is beginning to experience increasing pelvic pain. She states she is not      a candidate for birth control pills based on her age and her      hypertension. She has taken Lupron in the past with help and benefit but      at this pont she would like to attempt conception with Clomid and IUY. I      discussed with her that I have no opinion regarding those issues, as      those are not area of my expertise and cannot offer an opinion based on      those issues. From her standpoint, I believe she has two risk factors      for gynecologic malignancy. First, she has polycystic ovarian syndrome      and relative hyperestrogen state, along with her morbid obesity can      increase her risk for endometrial hyperplasia and endometrial carcinoma.     I discussed with her that this is an issue.  However, she has regular      monthly cyclic periods which should decrease her risk of that being an      issue. With regards to ovarian cancer risk, she is nulliparous. That in  and of itself increases her risk for ovarian carcinoma. She has been on      birth control pills for a total of 10 years in her lifetime which will      decrease her risk of ovarian cancer by more than 50%. The next issue is      her bright red blood per rectum. She attributes this to endometriosis as      she only notices it when she has her menses. I recommended colonoscopy      during her menses to evaluate this issue. Her aunt who had breast cancer      is dying of diffuse metastatic disease. It would not be unreasonable      because of the affected aunts - first degree relative also has      endometrial cancer - to consider the possibility of a genetic mutation,      but I at this point do not feel that the patient needs to be tested.  2.  I communicated this with the patient, then separately I re-communicated      it with her caseworker, as well as her      mother, and I spent approximately 40 minutes face-to-face time with the      patient and her family. I will be happy to see her should the need arise      in the future but at this time I do not feel that she needs evaluation      nor intervention by a gynecologic oncologist, and she will follow up      with her primary physician.       PAG/MEDQ  D:  09/09/2004  T:  09/09/2004  Job:  [id number]   cc:   Guy Sandifer. Henderson Cloud, M.D.  [address]  [place]  [place] [address]  Fax: [phone]   Telford Nab, R.N.  [phone] [address]  [place], [place] [address]

## 2010-08-08 NOTE — Assessment & Plan Note (Signed)
[facility]                             PULMONARY OFFICE NOTE   NAME:[name], [name]                     MRN:          [id number]  DATE:04/01/2006                            DOB:          06-18-1965    Ms. Stagner is a 45 year old white female seen today for progressive  cough that reoccurred a week ago. We saw her in early December with a  similar cough syndrome. At that time, she had discolored mucus and  increased post-nasal drainage. We diagnosed acute sinusitis, allergic  rhinitis, a bronchitic flare, and reflux. She was given amoxicillin for  7 days and Brovex nightly for 7 days. We doubled her Prilosec to b.i.d..  We also gave her Tessalon-Perles for cough and Nasonex b.i.d. She  maintained her Advair 100/50 1 spray b.i.d. The cough went away, but  then it recurred a week ago. She did receive Omnicef by a primary care  Pegeen Stiger without much change.   PHYSICAL EXAMINATION:  VITAL SIGNS: Temperature 98, blood pressure  118/72, pulse 89, saturation 98% on room air.  CHEST: Clear without evidence of wheeze or rhonchi. There was a  prominent pseudo wheeze ausculted over the neck with referred sounds to  the lower airways.  NARES: Showed mild nasal inflammation.  ORAL PHARYNX: Showed mild erythema in the posterior pharyngeal area with  cobblestoning.   IMPRESSION:  Reflux disease associated with upper airway irritability  aggravated by Advair usage with no active infection. Note is made of a  negative chest x-ray today and normal spirometry on pulmonary function  testing.   PLAN:  The patient is to use cyclic cough protocol with Tessalon-Perles  and Tussionex. The protocol was given to the patient. No further  antibiotics are indicated. She will switch from Advair to Qvar 40 mcg  strength 2 sprays b.i.d. She will switch from Prilosec to 40 mg daily  Zegerid and we will see the patient for followup in a month.     Charlcie Cradle Delford Field, MD,  [facility]  Electronically Signed    PEW/MedQ  DD: 04/01/2006  DT: 04/02/2006  Job #: [id number]   cc:   Evelena Peat, M.D.

## 2010-08-08 NOTE — Op Note (Signed)
NAME:  [name], [name]              ACCOUNT NO.:  [id number]   MEDICAL RECORD NO.:  [id number]          PATIENT TYPE:  INP   LOCATION:  0153                         FACILITY:  [facility]   PHYSICIAN:  Sandria Bales. Ezzard Standing, M.D.  DATE OF BIRTH:  Sep 23, 1965   DATE OF PROCEDURE:  01/01/2004  DATE OF DISCHARGE:                                 OPERATIVE REPORT   PREOPERATIVE DIAGNOSIS:  Morbidly obese, status post Roux-Y  gastrojejunostomy.   POSTOPERATIVE DIAGNOSIS:  Morbidly obese with status post laparoscopic Roux-  Y gastrojejunostomy with normal anastomosis.   PROCEDURE:  Esophagogastroscopy.   SURGEON:  Dr. Ezzard Standing   FIRST ASSISTANT:  None.   ANESTHESIA:  General endotracheal.   ESTIMATED BLOOD LOSS:  Minimal.   INDICATION FOR PROCEDURE:  [name] is a 45 year old female, who is  morbidly obese with a BMI of approximately 50.  Dr. Jaclynn Guarneri has done a  laparoscopic Roux-Y gastrojejunostomy, and I am now doing the endoscopy to  document the anastomosis, document that there is no air leak or no leak and  document the viability of the mucosa of the gastric pouch.    Operative Note:   The patient in supine position with her arms out to her side.  Dr. Johna Sheriff  is manning the laparoscope while I do the upper endoscopy.  I use a flexible  Olympus endoscope, passed it down the oropharynx without difficulty into her  gastric pouch.  Her gastrojejunal anastomosis was widely patent, probably 3+  cm in diameter.  The gastrojejunostomy measured about 45 cm from the  incisors.  The gastric pouch was viable.  I insufflated with air.  Dr.  Johna Sheriff at the same time flooded the upper abdomen with saline.  There was  no air leak or bubbles.  The  gastroesophageal junction was noted about 39 cm for a 6 cm pouch, and the  pouch mucosa otherwise looked okay.  The scope was withdrawn into the  esophagus.  The esophagus was unremarkable.  The scope was then withdrawn.   Dr. Johna Sheriff will dictate  the remainder of the operation on [name].     Davi   DHN/MEDQ  D:  01/01/2004  T:  01/01/2004  Job:  [id number]   cc:   Lorne Skeens. Hoxworth, M.D.  [address]  [place]  [place] [address]  Fax: [phone]

## 2010-08-08 NOTE — H&P (Signed)
NAME:  [name], [name]              ACCOUNT NO.:  [id number]   MEDICAL RECORD NO.:  [id number]          PATIENT TYPE:  AMB   LOCATION:  SDC                           FACILITY:  [facility]   PHYSICIAN:  Guy Sandifer. Henderson Cloud, M.D. DATE OF BIRTH:  [birth year]   DATE OF ADMISSION:  DATE OF DISCHARGE:                                HISTORY & PHYSICAL   DATE OF ADMISSION:  February 05, 2005   CHIEF COMPLAINT:  Right lower quadrant pain and miscarriage.   HISTORY OF PRESENT ILLNESS:  This patient is a 45 year old single white  female G0 P0 who has a history of endometriosis. She has had right lower  quadrant pain for some weeks. It is sometimes quite severe. Also is status  post artificial insemination. At approximately [redacted] weeks gestational age, she  was noted to have declining quantitative hCGs consistent with pregnancy  loss.  Ultrasound revealed a very small pseudo sac in the uterus and nothing  in the adnexa. Her quantitative hCGs then fell but soon plateaued and had a  mild re-elevation. She was then given methotrexate for a single dose.  Quantitative hCGs have been dropping and were noted to be 167 on February 03, 2005. However, she continues to have significant pain in the right lower  extremity on and off. She is being admitted for laparoscopy and dilatation  and evacuation. Potential risks and complications have been reviewed  preoperatively.   PAST MEDICAL HISTORY:  1.  Depression.  2.  Sleep apnea.  3.  Chronic hypertension.  4.  Type 2 diabetes.  5.  Reactive airway disease.  6.  Endometriosis.  7.  Polycystic ovarian syndrome.  8.  Plantar fasciitis.  9.  Question of connective tissue disease.  10. History of partially torn left Achilles tendon.  11. History of stress fracture on the right foot.   PAST SURGICAL HISTORY:  1.  Laparoscopy April 2006.  2.  Roux-en-Y gastric bypass October 2005.  3.  Hysteroscopy D&C in April 2005.  4.  Exploratory laparotomy in 1992.  5.   Laparoscopic cholecystectomy in 1995.  6.  Wisdom tooth extraction in 1989.   MEDICATIONS:  1.  Advair __________.  2.  Prozac 20 mg.  3.  Prilosec 20 mg.  4.  Albuterol p.r.n.  5.  Calcium and vitamin D.   ALLERGIES:  ERYTHROMYCIN and AUGMENTIN, both leading to nausea and vomiting,  and LATEX sensitivity.   FAMILY HISTORY:  Positive for peptic ulcer disease in father; colon polyps  in grandmother and uncle; cancer, heart attack, stroke, and diabetes.   SOCIAL HISTORY:  The patient denies tobacco, alcohol, or drug abuse.   OBSTETRICAL HISTORY:  As above.   REVIEW OF SYSTEMS:  NEUROLOGIC:  Denies headache. CARDIAC:  Denies chest  pain. PULMONARY:  Has reactive airway disease as above. GI:  Denies recent  changes in bowel habits.   PHYSICAL EXAMINATION:  VITAL SIGNS:  Height 5 feet 10-and-one-half inches,  weight 242 pounds, blood pressure 124/70.  HEENT:  Without thyromegaly.  LUNGS:  Clear to auscultation.  HEART:  Regular rate and rhythm.  BREASTS:  Not examined.  ABDOMEN:  Soft, nontender, without masses.  PELVIC:  Compromised by patient habitus.  EXTREMITIES:  Grossly within normal limits.  NEUROLOGIC:  Grossly within normal limits.   ASSESSMENT:  1.  Right lower quadrant pain.  2.  Miscarriage.   PLAN:  Laparoscopy, dilatation and evacuation.      Guy Sandifer Henderson Cloud, M.D.  Electronically Signed     JET/MEDQ  D:  02/04/2005  T:  02/04/2005  Job:  [id number]

## 2010-08-08 NOTE — Discharge Summary (Signed)
NAME:  [name], [name]              ACCOUNT NO.:  [id number]   MEDICAL RECORD NO.:  [id number]          PATIENT TYPE:  INP   LOCATION:  0477                         [facility]ACILITY:  [facility]   PHYSICIAN:  Sharlet Salina T. Hoxworth, M.D.DATE OF BIRTH:  17-May-1965   DATE OF ADMISSION:  01/01/2004  DATE OF DISCHARGE:  01/04/2004                                 DISCHARGE SUMMARY   DISCHARGE DIAGNOSES:  1.  Morbid obesity.  2.  Hypertension.  3.  Sleep apnea.  4.  Arthritis.  5.  Stress urinary incontinence.  6.  Gastroesophageal reflux disease.   OPERATIONS AND PROCEDURES:  Laparoscopic Roux-Y gastric bypass on January 01, 2004.   HISTORY OF PRESENT ILLNESS:  This patient is a 45 year old female with a  long history of progressive morbid obesity, unresponsive to medical  management.  She has developed multiple comorbidities as described below.  Following extensive outpatient preoperative evaluation, she is admitted  electively for laparoscopic Roux-Y gastric bypass.   PAST MEDICAL HISTORY:   SURGICAL:  Laparoscopic cholecystectomy.   MEDICAL:  1.  She is followed by Dr. Evelena Peat for hypertension and      osteoarthritis.  2.  She is on CPAP for sleep apnea.  3.  She has significant GERD and reflux.  4.  She has a history of asthmatic bronchitis.  5.  She has multiple orthopedic issues including plantar fasciitis and      arthritis.  6.  She has been followed by Dr. Guy Sandifer. Tomblin for endometriosis and      pelvic pain.  7.  She has been found to have insulin resistance with high insulin levels      and normal glucose.   MEDICATIONS ON ADMISSION:  1.  Bextra 20 mg b.i.d.  2.  Lisinopril 10 mg daily.  3.  Prozac 20 mg daily.  4.  Lasix 40 mg daily.  5.  Multivitamin daily.  6.  Albuterol MDI p.r.n.  7.  Advair Diskus b.i.d.  8.  Prevacid 30 mg daily.  9.  Nasacort AQ daily.   ALLERGIES:  She is allergic to ERYTHROMYCIN and has a LATEX ALLERGY.   SOCIAL HISTORY/FAMILY  HISTORY/REVIEW OF SYSTEMS:  See the detailed H&P.   PERTINENT EXAM:  GENERAL:  Five feet 11 inches, 382 pounds, BMI 53.  Physical exam was remarkable really only for morbid obesity.   HOSPITAL COURSE:  The patient was admitted on the morning of her procedure  and underwent an uneventful laparoscopic Roux-Y gastric bypass.  Her  postoperative course was smooth.  Gastrografin swallow showed no evidence of  leak or obstruction.  She was started on limited liquids and was advanced to  unlimited full liquids on the second postop day.  Hemoglobin remained stable  at around 11, white count was 8.3.  She was advanced to full liquids, which  she tolerated well, and was discharged home on January 04, 2004.  She was  afebrile.  Abdomen was soft and nontender.   FOLLOWUP:  Followup is to be in our office in 3-4 days for drain removal.  BTH/MEDQ  D:  01/22/2004  T:  01/23/2004  Job:  [id number]   cc:   Evelena Peat, M.D.  [address]  [place]  [place] [address]  Fax: [phone]

## 2010-08-08 NOTE — Discharge Summary (Signed)
NAME:  [name], [name]              ACCOUNT NO.:  [id number]   MEDICAL RECORD NO.:  [id number]          PATIENT TYPE:  INP   LOCATION:  9123                          FACILITY:  [facility]   PHYSICIAN:  Zelphia Cairo, MD    DATE OF BIRTH:  October 22, 1965   DATE OF ADMISSION:  08/03/2006  DATE OF DISCHARGE:  08/07/2006                               DISCHARGE SUMMARY   ADMITTING DIAGNOSES:  1. Intrauterine pregnancy at 38-1/7 weeks' estimated gestational age.  2. Induction of labor secondary to pregnancy-induced hypertension.   DISCHARGE DIAGNOSES:  1. Status post low transverse cesarean section secondary to failed      induction of labor.  2. Viable female infant.   PROCEDURE:  Primary low transverse cesarean section.   REASON FOR ADMISSION:  Please see dictated H&P.   HOSPITAL COURSE:  The patient a 45 year old, white, single female,  gravida 2, para 0, that was admitted to [facility] for  a two-stage induction of labor.  The patient had known chronic  hypertension and had developed some increasing labile blood pressures.  On admission, the patient did undergo Cytotec for ripening of her cervix  and subsequently was started on some Pitocin.  The patient failed to  achieve adequate labor.  Fetal heart tones remained reactive.  Decision  was made to proceed with a primary low transverse cesarean section.  The  patient was transferred to the operating room where spinal anesthesia  was administered without difficulty.  Low transverse incision was made  with delivery of a viable female infant weighing 7 pounds 3 ounces,  Apgars of 8 at one minute and 9 at five minutes.  Arterial cord pH of  7.29.  The patient tolerated the procedure well and taken to the  recovery room in stable condition.  On postoperative day #1, the patient  complained of a rash from the adhesive.  Otherwise, she was without  complaint.  Vital signs were stable.  She is afebrile.  Blood pressure  108/68 to  114/63.  Deep tendon reflexes were 1+ without clonus.  The  patient did have some pedal edema.  Abdomen was soft with good return of  bowel function.  Fundus was firm and nontender.  Rash was noted on  abdomen from the tape.  Incision was noted to have a small amount oozing  from the right margin of the incision.  Foley was draining well.  Laboratory findings revealed a hemoglobin of 10.4, platelet count of  232,000, WBC count of 10.8.  Hydrocortisone was ordered for the rash on  the abdomen.  On postoperative day #2, the patient was without  complaint.  Vital signs were stable.  She was afebrile.  Fundus firm and  nontender.  Incision was clean, dry and intact.  The patient was  ambulating well and tolerating a regular diet without complaints of  nausea and vomiting.  On postoperative day #3, the patient was without  complaint.  Vital signs were stable.  She was afebrile.  Abdomen was  soft.  Incision was clean, dry and intact with a small amount of the  erythema  about 1 cm above the incision which was thought to be reaction  to the tape.  No warmth or induration or fluctuance was noted.  Staples  were left in place, and the patient was later discharged home.   CONDITION ON DISCHARGE:  Stable.   DIET:  Regular as tolerated.   ACTIVITY:  No heavy lifting, no driving x2 weeks, no vaginal entry.   FOLLOW UP:  Patient to follow up in the office in 2 to 3 days for staple  removal.  She is to call for temperature greater than 100 degrees,  persistent nausea and vomiting, heavy vaginal bleeding and/or redness or  drainage from incisional site.   DISCHARGE MEDICATION:  1. Tylox #30 one p.o. every 4 to 6 hours.  2. Motrin 600 mg every 6 hours.  3. Prenatal vitamins 1 p.o. daily.  4. Colace 1 p.o. daily p.r.n.      Julio Sicks, N.P.      Zelphia Cairo, MD  Electronically Signed    CC/MEDQ  D:  09/20/2006  T:  09/20/2006  Job:  [id number]

## 2010-08-08 NOTE — Assessment & Plan Note (Signed)
[facility]                             PULMONARY OFFICE NOTE   NAME:[name], [name]                     MRN:          [id number]  DATE:04/23/2006                            DOB:          April 14, 1965    [name] returns today in followup.  This is a 45 year old white  female with mild intermittent asthma, reflux, disease, obesity,  obstructive sleep apnea.  Her cyclic cough is markedly improved compared  to her last visit on April 01, 2006.  She is having no dyspnea at  time, she is now on the Qvar 40 mg strength 2 sprays b.i.d., Nasonex as  needed daily 2 sprays.  She is on Zegerid now 40 mg a day and off the  Prilosec.  Overall now markedly better.   EXAMINATION:  Temperature 97, blood pressure 106/68, pulse 67,  saturation 100% room air.  CHEST:  Showed distant breath sounds but no evidence of wheeze or  rhonchi.  CARDIAC:  Showed a regular rate and rhythm without S3, normal S1, S2.  ABDOMEN:  Soft, nontender.  EXTREMITIES:  Showed no edema or clubbing.  SKIN:  Clear.  HEENT:  Nares were clear, oropharynx was clear.  NECK:  Supple.   IMPRESSION:  That of resolved cyclic cough, stable airflow function, no  evidence of active infection at this time.   PLAN:  To maintain Qvar daily and Nasonex daily, and Zegerid 40 mg  daily.  Ninety day supplies of the Qvar and Zegerid were phoned in to  [facility].     Luisa Hart E. Delford Field, MD, [facility]  Electronically Signed    PEW/MedQ  DD: 04/23/2006  DT: 04/23/2006  Job #: [id number]   cc:   Evelena Peat, M.D.

## 2010-08-08 NOTE — H&P (Signed)
NAME:  [name], [name]              ACCOUNT NO.:  [id number]   MEDICAL RECORD NO.:  [id number]          PATIENT TYPE:  AMB   LOCATION:  [facility]                           FACILITY:  [facility]   PHYSICIAN:  Guy Sandifer. Tomblin II, M.D.DATE OF BIRTH:  1966-01-08   DATE OF ADMISSION:  06/23/2004  DATE OF DISCHARGE:                                HISTORY & PHYSICAL   CHIEF COMPLAINT:  Heavy painful periods.   INDICATIONS AND CONSENT:  This patient is a 45 year old, single white  female, G0, P0, with known endometriosis who has increasingly heavy and  painful menses.  The pain is at times becoming debilitating.  She is status  post laparoscopy and cannot take nonsteroidal agents.  Narcotics have only  been of marginal help, and she cannot take these at work.  Ultrasound in my  office June 17, 2004 reveals the uterus measuring 7.5 x 3.3 x 4.5 cm with a  0.7-cm endometrial stripe.  There is a 1.7-cm simple cyst on the right  ovary, and the left ovary is normal.  No free fluid was noted.  After  discussion of the options, she is being admitted for hysteroscopy,  dilatation, curettage, and open laparoscopy.   PAST MEDICAL HISTORY:  1.  Depression.  2.  Sleep apnea.  3.  Chronic hypertension.  4.  Type 2 diabetes.  5.  Reactive airway disease.  6.  Endometriosis.  7.  Polycystic ovarian syndrome.  8.  Plantar fasciitis.  9.  Question of connective tissue disease.  10. History of partially torn left Achilles tendon.  11. History of stress fracture of the right foot.   PAST SURGICAL HISTORY:  1.  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  2.  Hysteroscopy, D&C in April of 2005.  3.  Exploratory laparotomy in 1992.  4.  Laparoscopic cholecystectomy in 1995.  5.  Wisdom tooth extraction in 1989.   MEDICATIONS:  1.  Prozac 20 mg daily.  2.  Advair 50/100 mg b.i.d.  3.  Lisinopril 5 mg daily.  4.  Cardizem 240 mg daily.  5.  Prevacid 30 mg daily.  6.  Calcium and vitamins daily.    ALLERGIES:  1.  LATEX.  2.  History of ERYTHROMYCIN and AUGMENTIN both leading to nausea and      vomiting.   FAMILY HISTORY:  Positive for peptic ulcer disease in father, colon polyps  in grandmother and uncle as well as cancer, heart attack, stroke, and  diabetes.   SOCIAL HISTORY:  The patient denies tobacco, alcohol, or drug abuse.   OBSTETRIC HISTORY:  Negative.   REVIEW OF SYSTEMS:  NEUROLOGIC:  Denies headache.  CARDIAC:  Denies chest  pain.  PULMONARY:  Reactive airway disease as above.   PHYSICAL EXAMINATION:  VITAL SIGNS:  Height 5 feet, 10.5 inches, weight 286  pounds.  Blood pressure 100/70.  HEENT:  Without thyromegaly.  LUNGS:  Clear to auscultation.  HEART:  Regular rate and rhythm.  BACK:  Without CVA tenderness.  BREASTS:  Without masses, retraction, or discharge.  ABDOMEN:  Obese, soft, nontender, without palpable masses.  PELVIC EXAM:  Vulva, vagina, and cervix without lesion.  Uterus is normal  size, mobile, and nontender.  Adnexa nontender without masses.  Exam  compromised with patient habitus.  EXTREMITIES AND NEUROLOGICAL EXAM:  Grossly within normal limits.   ASSESSMENT:  Dysmenorrhea and menorrhagia.   PLAN:  Laparoscopy, hysteroscopy, dilatation and curettage.      JET/MEDQ  D:  06/22/2004  T:  06/22/2004  Job:  [id number]

## 2010-08-08 NOTE — Assessment & Plan Note (Signed)
[facility]                             PULMONARY OFFICE NOTE   NAME:[name], Carol BLASDEL                     MRN:          [id number]  DATE:02/15/2006                            DOB:          12-17-1965    [name] is a 45 year old female, history of mild intermittent  asthma, obstructive sleep apnea, morbid obesity.  She has lost from 394  pounds down to 248 following her gastric bypass surgery.  She is  complaining today of some postnasal drainage, sneezing and a dry cough.  She maintains Advair 100/50 one spray b.i.d., Prilosec 20 mg daily.  Other maintenance medicines listed in the chart are correct as reviewed.   EXAMINATION:  VITAL SIGNS:  Temperature 98, blood pressure 116/74, pulse  94, saturation 97% on room air.  CHEST:  Showed diminished breath sounds, prolonged expiratory phase, no  wheeze or rhonchi noted.  CARDIAC:  Showed a regular rate and rhythm without S3; normal S1, S2.  ABDOMEN:  Soft, nontender.  EXTREMITIES:  Showed no edema or clubbing.  SKIN:  Clear.  NEUROLOGIC:  Intact.  HEENT:  Showed no jugular venous distention, no lymphadenopathy,  oropharynx clear, neck supple.   IMPRESSION:  1. Mild early sinusitis.  2. Mild acute bronchitis with mild asthma flare.  3. Obstructive sleep apnea, stable.   PLAN:  The patient received doxycycline 100 mg twice a day for a 7-day  course.  The patient will also receive Nasonex two sprays each nostril  daily x2 sample boxes and discontinue.  The patient will return to  followup in 3 months.     Charlcie Cradle Delford Field, MD, [facility]  Electronically Signed    PEW/MedQ  DD: 02/15/2006  DT: 02/15/2006  Job #: [id number]   cc:   Evelena Peat, M.D.

## 2010-08-08 NOTE — Assessment & Plan Note (Signed)
[facility]                             PULMONARY OFFICE NOTE   NAME:Schimpf, ORA BOLLIG                     MRN:          [id number]  DATE:06/21/2006                            DOB:          Jul 29, 1965    Ms. Maranto is seen today in followup.  This is a 45 year old, white  female with history of moderate persistent asthma, obstructive sleep  apnea.  She is having increased sneezing, postnasal drainage and cough  with this.  She is on the QVAR two sprays b.i.d. 40 mcg strength,  Nasonex two sprays each nostril daily, albuterol p.r.n., Zegerid 40 mg  daily.   PHYSICAL EXAMINATION:  VITAL SIGNS:  Temperature 98.  Blood pressure  110/66.  Pulse 74.  Saturation 99 percent on room air.  CHEST:  Completely clear without evidence of wheeze, rale or rhonchi.  CARDIAC:  Regular rate and rhythm, without S3.  Normal S1, S2.  ABDOMEN:  Soft, nontender.  EXTREMITIES:  No edema, clubbing or venous disease.  SKIN:  Clear.   Spirometry was obtained today and showed normal spirometry.   IMPRESSION:  1. Stable air flow function.  2. No evidence of active asthma flare.  3. Associated acute allergic rhinitis with postnasal drip syndrome and      cyclic cough.   PLAN:  Obtain for the patient refill on the QVAR and Nasonex.  No other  change in plan of care is made and we will see the patient back in  return followup in three months.     Charlcie Cradle Delford Field, MD, [facility]  Electronically Signed    PEW/MedQ  DD: 06/21/2006  DT: 06/22/2006  Job #: [id number]   cc:   Evelena Peat, M.D.

## 2010-08-08 NOTE — Op Note (Signed)
NAME:  [name], [name]              ACCOUNT NO.:  [id number]   MEDICAL RECORD NO.:  [id number]          PATIENT TYPE:  AMB   LOCATION:  [facility]                           FACILITY:  [facility]   PHYSICIAN:  Guy Sandifer. Tomblin II, M.D.DATE OF BIRTH:  1965-09-18   DATE OF PROCEDURE:  06/23/2004  DATE OF DISCHARGE:                                 OPERATIVE REPORT   PREOPERATIVE DIAGNOSES:  1.  Menorrhagia.  2.  Dysmenorrhea.   POSTOPERATIVE DIAGNOSES:  1.  Endometriosis.  2.  Right ovarian cyst.  3.  Menorrhagia.   PROCEDURE:  Laparoscopy with ablation of endometriosis and aspiration of  right ovarian cyst, hysteroscopy with dilatation and curettage, and 1%  Xylocaine paracervical block.   SURGEON:  Guy Sandifer. Henderson Cloud, M.D.   ANESTHESIA:  General.   SPECIMENS:  1.  Endometrial curetting.  2.  Aspirate of right ovarian cyst.   INS AND OUTS:  Sorbitol distending media, 0 mL deficit.   INDICATIONS AND CONSENT:  This patient is a 45 year old single, white female  G0, P0 with noted endometriosis, increasingly heavy and painful menses.  Details are dictated in the History and Physical.  Laparoscopy with  hysteroscopy, dilatation/curettage has been discussed with the patient  preoperative.  Potential risks and complications have been reviewed  preoperatively including but not limited to infection, bowel, bladder,  ureteral damage, bleeding requiring transfusion of blood products, and  possible transfusion reaction, HIV and hepatitis acquisition, DVT, PE,  pneumonia, recurrent pain, and/or heavy bleeding.  All questions were  answered and consent was signed on the chart.   FINDINGS:  The uterine cavity is normal.  Both fallopian tube ostia are  identified.  The uterus is normal in size.  There is a single powder burn  type implant of endometriosis on the left pelvic sidewall.  The right ovary  contains a 3 cm translucent cyst.  Left ovary is normal.  Posterior cul-de-  sac and anterior  cul-de-sac were normal.   PROCEDURE IN DETAIL:  The patient was taken to the operating room where she  was identified, placed in the dorsal supine position, general anesthesia  induced via endotracheal intubation.  She was then placed in the dorsal  lithotomy position, prepped abdominally and vaginally, bladder straight  catheterized, and she was draped in a sterile fashion.  Bivalve speculum was  placed in the vagina.  The anterior cervical lip was injected with 1%  Xylocaine and grasped with single-tooth tenaculum.  Paracervical block was  placed at the 2, 4, 5, 7, 8, and 10 o'clock positions with approximately 20  mL total, 1% Xylocaine.  The cervix was gently progressively dilated.  Diagnostic hysteroscope was placed into the cervical canal and advanced  under direct visualization using sorbitol-distending media.  The above  findings were noted.  The hysteroscope was withdrawn, sharp curettage was  carried out.  The single tooth was replaced with a Hulka tenaculum.   Attention was then turned to the abdomen.  An infra-umbilical incision was  made and dissection was carried out in layers to the peritoneum which was  entered bluntly.  Anchoring  sutures of 0 Vicryl were placed at each angle of  the fascia and a disposable, open trocar sleeve is placed and anchored down.  Pneumoperitoneum is induced and no damage to surrounding structures is noted  upon close inspection with the laparoscope.  Small suprapubic incision is  made and a 5 mm disposable trocar sleeve is placed under direct  visualization without difficulty.  The above findings were noted.  The right  ovarian cyst is aspirated for straw-colored serous fluid, sent to pathology.  Bipolar cautery was used to ablate the spot of endometriosis.  This was  cleared of the course of the ureter.  Good hemostasis is noted on the ovary.  At this point, Dr. Johna Sheriff performs his diagnostic laparoscopy in the upper  abdomen.  At the  completion of his portion of the case, the left upper  quadrant trocar he placed is removed under direct visualization with Dr.  Johna Sheriff.  Suprapubic trocar sleeve is removed as well.  Pneumoperitoneum is  reduced.  The fascia is closed with a figure-of-eight 0 Vicryl on the  anterior fascia with good visualization avoiding underlying structures.  The  skin is closed with interrupted 2-0 Vicryl suture on the umbilicus.  Both  incisions are injected with 1% Xylocaine.  All three incisions then closed  with Dermabond.  Hulka tenaculum is removed.  All counts were correct.      JET/MEDQ  D:  06/23/2004  T:  06/23/2004  Job:  [id number]

## 2010-08-12 ENCOUNTER — Encounter: Payer: Self-pay | Admitting: Family Medicine

## 2010-08-12 ENCOUNTER — Ambulatory Visit (INDEPENDENT_AMBULATORY_CARE_PROVIDER_SITE_OTHER): Payer: 59 | Admitting: Family Medicine

## 2010-08-12 VITALS — BP 128/80 | Temp 98.8°F | Wt 250.0 lb

## 2010-08-12 DIAGNOSIS — B9789 Other viral agents as the cause of diseases classified elsewhere: Secondary | ICD-10-CM

## 2010-08-12 DIAGNOSIS — B349 Viral infection, unspecified: Secondary | ICD-10-CM

## 2010-08-12 DIAGNOSIS — R509 Fever, unspecified: Secondary | ICD-10-CM

## 2010-08-12 NOTE — Progress Notes (Signed)
  Subjective:    Patient ID: [name], female    DOB: Apr 02, 1965, 45 y.o.   MRN: [id number]  HPI Here with continued symptoms of fever to 101 degrees at night, joint aches, HA, ST, a slight dry cough, and mild nausea. She has also been very fatigued. This started about 9 days ago and has persisted since then. She saw Dr. Caryl Never a few days ago, and a CBC that day showed a low WBC count at 3.2 with an elevated monocyte count. She is taking some Voltaren and drinking fluids. No change in bowel or bladder habits. No abdominal pain.    Review of Systems  Constitutional: Positive for fever, chills and fatigue.  HENT: Positive for sore throat. Negative for neck pain and neck stiffness.   Eyes: Negative.   Respiratory: Positive for cough.   Cardiovascular: Negative.   Gastrointestinal: Positive for nausea. Negative for vomiting and abdominal distention.  Skin: Negative.        Objective:   Physical Exam  Constitutional: She appears well-developed and well-nourished.  HENT:  Right Ear: External ear normal.  Left Ear: External ear normal.  Nose: Nose normal.  Mouth/Throat: Oropharynx is clear and moist. No oropharyngeal exudate.  Eyes: Conjunctivae are normal. Pupils are equal, round, and reactive to light. No scleral icterus.  Neck: Normal range of motion. Neck supple. No thyromegaly present.  Cardiovascular: Normal rate, regular rhythm, normal heart sounds and intact distal pulses.  Exam reveals no gallop and no friction rub.   No murmur heard. Pulmonary/Chest: Effort normal and breath sounds normal.  Abdominal: Soft. Bowel sounds are normal. She exhibits no distension and no mass. There is no tenderness. There is no rebound and no guarding.       No hepatosplenomegaly   Lymphadenopathy:    She has no cervical adenopathy.  Skin: Skin is warm and dry. No rash noted. No erythema. No pallor.          Assessment & Plan:  This is most likely mononucleosis. She knows this is  self limited, she will rest and drink fluids. She has been working through this but is not scheduled to work again for [employer]. Get EBV titers.

## 2010-08-14 ENCOUNTER — Telehealth: Payer: Self-pay | Admitting: Family Medicine

## 2010-08-14 NOTE — Telephone Encounter (Signed)
Pt called req lab results. Pls call back asap today.

## 2010-08-14 NOTE — Telephone Encounter (Signed)
Pt notified of labs results.  Pos EBV IgM indicates likely acute infection with EBV.  She is feeling slightly better.  Still has some low grade fever and malaise but some better.  Advised this may take several more weeks to resolve. Plenty of rest and fluids.

## 2010-08-19 NOTE — Progress Notes (Signed)
Pt informed

## 2010-09-08 ENCOUNTER — Other Ambulatory Visit: Payer: Self-pay | Admitting: *Deleted

## 2010-09-08 MED ORDER — EPINEPHRINE 0.3 MG/0.3ML IJ DEVI: 0.3000 mg | Freq: Once | INTRAMUSCULAR | Status: DC

## 2010-09-08 NOTE — Telephone Encounter (Signed)
Mother here for OV, requesting Epi pen for her daughter who is a patient here, however has just taken a job in [place].  Per Dr Caryl Never, OK to print Rx

## 2011-01-08 LAB — CBC
HCT: 30.8 — ABNORMAL LOW
MCHC: 34.2
MCV: 98.3
RBC: 3.14 — ABNORMAL LOW
WBC: 7.3

## 2011-01-08 LAB — COMPREHENSIVE METABOLIC PANEL
AST: 41 — ABNORMAL HIGH
BUN: 4 — ABNORMAL LOW
CO2: 35 — ABNORMAL HIGH
Calcium: 7.3 — ABNORMAL LOW
Chloride: 109
Creatinine, Ser: 1.1
GFR calc Af Amer: >60
GFR calc non Af Amer: 55 — ABNORMAL LOW
Glucose, Bld: 99
Total Bilirubin: 0.4

## 2014-07-03 ENCOUNTER — Other Ambulatory Visit: Payer: Self-pay | Admitting: Obstetrics and Gynecology

## 2014-07-04 LAB — CYTOLOGY - PAP

## 2015-09-18 ENCOUNTER — Other Ambulatory Visit: Payer: Self-pay | Admitting: Obstetrics and Gynecology

## 2015-09-18 DIAGNOSIS — R928 Other abnormal and inconclusive findings on diagnostic imaging of breast: Secondary | ICD-10-CM

## 2015-09-25 ENCOUNTER — Ambulatory Visit
Admission: RE | Admit: 2015-09-25 | Discharge: 2015-09-25 | Disposition: A | Discharge status: Home / Self Care | Payer: Managed Care, Other (non HMO) | Source: Ambulatory Visit | Attending: Obstetrics and Gynecology | Admitting: Obstetrics and Gynecology

## 2015-09-25 ENCOUNTER — Other Ambulatory Visit: Payer: Self-pay | Admitting: Obstetrics and Gynecology

## 2015-09-25 DIAGNOSIS — R928 Other abnormal and inconclusive findings on diagnostic imaging of breast: Secondary | ICD-10-CM

## 2015-09-25 DIAGNOSIS — N644 Mastodynia: Secondary | ICD-10-CM

## 2018-04-29 ENCOUNTER — Other Ambulatory Visit: Payer: Self-pay | Admitting: Surgery

## 2018-06-16 ENCOUNTER — Ambulatory Visit (HOSPITAL_COMMUNITY): Admit: 2018-06-16 | Payer: Managed Care, Other (non HMO) | Admitting: Surgery

## 2018-06-16 ENCOUNTER — Encounter (HOSPITAL_COMMUNITY): Payer: Self-pay

## 2018-06-16 SURGERY — EGD (ESOPHAGOGASTRODUODENOSCOPY)
Anesthesia: Monitor Anesthesia Care

## 2018-09-29 ENCOUNTER — Encounter (HOSPITAL_COMMUNITY): Admission: RE | Payer: Self-pay | Source: Home / Self Care

## 2018-09-29 ENCOUNTER — Ambulatory Visit (HOSPITAL_COMMUNITY): Admission: RE | Admit: 2018-09-29 | Payer: Managed Care, Other (non HMO) | Source: Home / Self Care | Admitting: Surgery

## 2018-09-29 SURGERY — EGD (ESOPHAGOGASTRODUODENOSCOPY)
Anesthesia: Moderate Sedation

## 2018-11-22 DIAGNOSIS — S82899A Other fracture of unspecified lower leg, initial encounter for closed fracture: Secondary | ICD-10-CM

## 2018-11-22 HISTORY — DX: Other fracture of unspecified lower leg, initial encounter for closed fracture: S82.899A

## 2018-12-08 ENCOUNTER — Emergency Department (HOSPITAL_COMMUNITY): Payer: Managed Care, Other (non HMO)

## 2018-12-08 ENCOUNTER — Inpatient Hospital Stay (HOSPITAL_COMMUNITY)
Admission: EM | Admit: 2018-12-08 | Discharge: 2018-12-16 | DRG: 982 | Disposition: A | Discharge status: Rehabilitation Facility | Payer: Managed Care, Other (non HMO) | Attending: General Surgery | Admitting: General Surgery

## 2018-12-08 ENCOUNTER — Encounter (HOSPITAL_COMMUNITY): Payer: Self-pay | Admitting: Radiology

## 2018-12-08 ENCOUNTER — Other Ambulatory Visit: Payer: Self-pay

## 2018-12-08 DIAGNOSIS — Z9119 Patient's noncompliance with other medical treatment and regimen: Secondary | ICD-10-CM

## 2018-12-08 DIAGNOSIS — Z8619 Personal history of other infectious and parasitic diseases: Secondary | ICD-10-CM

## 2018-12-08 DIAGNOSIS — S22050A Wedge compression fracture of T5-T6 vertebra, initial encounter for closed fracture: Secondary | ICD-10-CM

## 2018-12-08 DIAGNOSIS — Z09 Encounter for follow-up examination after completed treatment for conditions other than malignant neoplasm: Secondary | ICD-10-CM

## 2018-12-08 DIAGNOSIS — S27321A Contusion of lung, unilateral, initial encounter: Secondary | ICD-10-CM | POA: Diagnosis not present

## 2018-12-08 DIAGNOSIS — S2241XA Multiple fractures of ribs, right side, initial encounter for closed fracture: Secondary | ICD-10-CM | POA: Diagnosis present

## 2018-12-08 DIAGNOSIS — D62 Acute posthemorrhagic anemia: Secondary | ICD-10-CM | POA: Diagnosis not present

## 2018-12-08 DIAGNOSIS — T1490XA Injury, unspecified, initial encounter: Secondary | ICD-10-CM

## 2018-12-08 DIAGNOSIS — Z881 Allergy status to other antibiotic agents status: Secondary | ICD-10-CM

## 2018-12-08 DIAGNOSIS — F339 Major depressive disorder, recurrent, unspecified: Secondary | ICD-10-CM

## 2018-12-08 DIAGNOSIS — Z7901 Long term (current) use of anticoagulants: Secondary | ICD-10-CM

## 2018-12-08 DIAGNOSIS — F329 Major depressive disorder, single episode, unspecified: Secondary | ICD-10-CM | POA: Diagnosis present

## 2018-12-08 DIAGNOSIS — S42295A Other nondisplaced fracture of upper end of left humerus, initial encounter for closed fracture: Secondary | ICD-10-CM | POA: Diagnosis present

## 2018-12-08 DIAGNOSIS — Z20828 Contact with and (suspected) exposure to other viral communicable diseases: Secondary | ICD-10-CM | POA: Diagnosis present

## 2018-12-08 DIAGNOSIS — F419 Anxiety disorder, unspecified: Secondary | ICD-10-CM | POA: Diagnosis present

## 2018-12-08 DIAGNOSIS — Z91030 Bee allergy status: Secondary | ICD-10-CM

## 2018-12-08 DIAGNOSIS — G4733 Obstructive sleep apnea (adult) (pediatric): Secondary | ICD-10-CM | POA: Diagnosis present

## 2018-12-08 DIAGNOSIS — I480 Paroxysmal atrial fibrillation: Secondary | ICD-10-CM | POA: Diagnosis present

## 2018-12-08 DIAGNOSIS — I7300 Raynaud's syndrome without gangrene: Secondary | ICD-10-CM | POA: Diagnosis present

## 2018-12-08 DIAGNOSIS — J939 Pneumothorax, unspecified: Secondary | ICD-10-CM

## 2018-12-08 DIAGNOSIS — G4700 Insomnia, unspecified: Secondary | ICD-10-CM | POA: Diagnosis present

## 2018-12-08 DIAGNOSIS — Z6841 Body Mass Index (BMI) 40.0 and over, adult: Secondary | ICD-10-CM

## 2018-12-08 DIAGNOSIS — K5900 Constipation, unspecified: Secondary | ICD-10-CM | POA: Diagnosis not present

## 2018-12-08 DIAGNOSIS — Z9884 Bariatric surgery status: Secondary | ICD-10-CM

## 2018-12-08 DIAGNOSIS — Z23 Encounter for immunization: Secondary | ICD-10-CM

## 2018-12-08 DIAGNOSIS — Z419 Encounter for procedure for purposes other than remedying health state, unspecified: Secondary | ICD-10-CM

## 2018-12-08 DIAGNOSIS — T148XXA Other injury of unspecified body region, initial encounter: Secondary | ICD-10-CM

## 2018-12-08 DIAGNOSIS — S22051A Stable burst fracture of T5-T6 vertebra, initial encounter for closed fracture: Secondary | ICD-10-CM | POA: Diagnosis present

## 2018-12-08 DIAGNOSIS — K219 Gastro-esophageal reflux disease without esophagitis: Secondary | ICD-10-CM | POA: Diagnosis present

## 2018-12-08 DIAGNOSIS — J45909 Unspecified asthma, uncomplicated: Secondary | ICD-10-CM | POA: Diagnosis present

## 2018-12-08 DIAGNOSIS — M79606 Pain in leg, unspecified: Secondary | ICD-10-CM

## 2018-12-08 DIAGNOSIS — S61412A Laceration without foreign body of left hand, initial encounter: Secondary | ICD-10-CM | POA: Diagnosis present

## 2018-12-08 DIAGNOSIS — S42102A Fracture of unspecified part of scapula, left shoulder, initial encounter for closed fracture: Secondary | ICD-10-CM | POA: Diagnosis present

## 2018-12-08 DIAGNOSIS — T07XXXA Unspecified multiple injuries, initial encounter: Secondary | ICD-10-CM

## 2018-12-08 DIAGNOSIS — Z79890 Hormone replacement therapy (postmenopausal): Secondary | ICD-10-CM

## 2018-12-08 DIAGNOSIS — I10 Essential (primary) hypertension: Secondary | ICD-10-CM | POA: Diagnosis present

## 2018-12-08 DIAGNOSIS — S0101XA Laceration without foreign body of scalp, initial encounter: Secondary | ICD-10-CM | POA: Diagnosis present

## 2018-12-08 DIAGNOSIS — E669 Obesity, unspecified: Secondary | ICD-10-CM | POA: Diagnosis present

## 2018-12-08 DIAGNOSIS — S51012A Laceration without foreign body of left elbow, initial encounter: Secondary | ICD-10-CM | POA: Diagnosis present

## 2018-12-08 DIAGNOSIS — Z91040 Latex allergy status: Secondary | ICD-10-CM

## 2018-12-08 DIAGNOSIS — E039 Hypothyroidism, unspecified: Secondary | ICD-10-CM | POA: Diagnosis present

## 2018-12-08 HISTORY — DX: Unspecified asthma, uncomplicated: J45.909

## 2018-12-08 HISTORY — DX: Chest pain, unspecified: R07.9

## 2018-12-08 HISTORY — DX: Unspecified atrial fibrillation: I48.91

## 2018-12-08 HISTORY — DX: Palpitations: R00.2

## 2018-12-08 HISTORY — DX: Carpal tunnel syndrome, right upper limb: G56.01

## 2018-12-08 HISTORY — DX: Plantar fascial fibromatosis: M72.2

## 2018-12-08 HISTORY — DX: Major depressive disorder, single episode, unspecified: F32.9

## 2018-12-08 HISTORY — DX: Lesion of ulnar nerve, unspecified upper limb: G56.20

## 2018-12-08 HISTORY — DX: Obstructive sleep apnea (adult) (pediatric): G47.33

## 2018-12-08 LAB — COMPREHENSIVE METABOLIC PANEL
ALT: 24 U/L (ref 0–44)
AST: 38 U/L (ref 15–41)
Albumin: 3.6 g/dL (ref 3.5–5.0)
Alkaline Phosphatase: 135 U/L — ABNORMAL HIGH (ref 38–126)
Anion gap: 10 (ref 5–15)
BUN: 10 mg/dL (ref 6–20)
CO2: 22 mmol/L (ref 22–32)
Calcium: 8.2 mg/dL — ABNORMAL LOW (ref 8.9–10.3)
Chloride: 106 mmol/L (ref 98–111)
Creatinine, Ser: 1.06 mg/dL — ABNORMAL HIGH (ref 0.44–1.00)
GFR calc Af Amer: >60 mL/min (ref >60)
GFR calc non Af Amer: 60 mL/min — ABNORMAL LOW (ref >60)
Glucose, Bld: 135 mg/dL — ABNORMAL HIGH (ref 70–99)
Potassium: 3.3 mmol/L — ABNORMAL LOW (ref 3.5–5.1)
Sodium: 138 mmol/L (ref 135–145)
Total Bilirubin: 0.6 mg/dL (ref 0.3–1.2)
Total Protein: 6.3 g/dL — ABNORMAL LOW (ref 6.5–8.1)

## 2018-12-08 LAB — CBC
HCT: 37.9 % (ref 36.0–46.0)
Hemoglobin: 12.5 g/dL (ref 12.0–15.0)
MCH: 34.4 pg — ABNORMAL HIGH (ref 26.0–34.0)
MCHC: 33 g/dL (ref 30.0–36.0)
MCV: 104.4 fL — ABNORMAL HIGH (ref 80.0–100.0)
Platelets: 288 10*3/uL (ref 150–400)
RBC: 3.63 MIL/uL — ABNORMAL LOW (ref 3.87–5.11)
RDW: 13.1 % (ref 11.5–15.5)
WBC: 18.6 10*3/uL — ABNORMAL HIGH (ref 4.0–10.5)
nRBC: 0 % (ref 0.0–0.2)

## 2018-12-08 LAB — I-STAT CHEM 8, ED
BUN: 10 mg/dL (ref 6–20)
Calcium, Ion: 1.06 mmol/L — ABNORMAL LOW (ref 1.15–1.40)
Chloride: 103 mmol/L (ref 98–111)
Creatinine, Ser: 0.9 mg/dL (ref 0.44–1.00)
Glucose, Bld: 131 mg/dL — ABNORMAL HIGH (ref 70–99)
HCT: 36 % (ref 36.0–46.0)
Hemoglobin: 12.2 g/dL (ref 12.0–15.0)
Potassium: 3.3 mmol/L — ABNORMAL LOW (ref 3.5–5.1)
Sodium: 140 mmol/L (ref 135–145)
TCO2: 23 mmol/L (ref 22–32)

## 2018-12-08 LAB — I-STAT BETA HCG BLOOD, ED (MC, WL, AP ONLY): I-stat hCG, quantitative: <5 m[IU]/mL (ref <5)

## 2018-12-08 LAB — LACTIC ACID, PLASMA: Lactic Acid, Venous: 2.3 mmol/L (ref 0.5–1.9)

## 2018-12-08 LAB — PROTIME-INR
INR: 1.2 (ref 0.8–1.2)
Prothrombin Time: 15.1 seconds (ref 11.4–15.2)

## 2018-12-08 LAB — SAMPLE TO BLOOD BANK

## 2018-12-08 LAB — ETHANOL: Alcohol, Ethyl (B): <10 mg/dL (ref <10)

## 2018-12-08 MED ORDER — IOHEXOL 300 MG/ML  SOLN
100.0000 mL | Freq: Once | INTRAMUSCULAR | Status: AC | PRN
  Administered 2018-12-08: 100 mL via INTRAVENOUS

## 2018-12-08 MED ORDER — FENTANYL CITRATE (PF) 100 MCG/2ML IJ SOLN
50.0000 ug | Freq: Once | INTRAMUSCULAR | Status: AC
  Administered 2018-12-08: 21:00:00 50 ug via INTRAVENOUS

## 2018-12-08 MED ORDER — LIDOCAINE-EPINEPHRINE (PF) 2 %-1:200000 IJ SOLN
10.0000 mL | Freq: Once | INTRAMUSCULAR | Status: AC
  Administered 2018-12-08: 10 mL
  Filled 2018-12-08: qty 20

## 2018-12-08 MED ORDER — FENTANYL CITRATE (PF) 100 MCG/2ML IJ SOLN
50.0000 ug | Freq: Once | INTRAMUSCULAR | Status: AC
  Administered 2018-12-08: 50 ug via INTRAVENOUS
  Filled 2018-12-08: qty 2

## 2018-12-08 MED ORDER — FENTANYL CITRATE (PF) 100 MCG/2ML IJ SOLN
INTRAMUSCULAR | Status: AC
  Filled 2018-12-08: qty 2

## 2018-12-08 MED ORDER — TETANUS-DIPHTH-ACELL PERTUSSIS 5-2.5-18.5 LF-MCG/0.5 IM SUSP
0.5000 mL | Freq: Once | INTRAMUSCULAR | Status: AC
  Administered 2018-12-08: 23:00:00 0.5 mL via INTRAMUSCULAR
  Filled 2018-12-08: qty 0.5
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
## 2018-12-08 NOTE — ED Notes (Signed)
Lac to dorsum L elbow approx 4cm, lac to dorsum L hand approx 3cm
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
## 2018-12-08 NOTE — ED Notes (Signed)
Pt called out notifying this RN that the L hand 3rd 4th and 5th digit with some numbness and difficulty with movement. EDP notified and pt to be next for CT scan.

## 2018-12-09 ENCOUNTER — Encounter (HOSPITAL_COMMUNITY): Payer: Self-pay

## 2018-12-09 ENCOUNTER — Inpatient Hospital Stay (HOSPITAL_COMMUNITY): Payer: Managed Care, Other (non HMO)

## 2018-12-09 DIAGNOSIS — S61412A Laceration without foreign body of left hand, initial encounter: Secondary | ICD-10-CM | POA: Diagnosis present

## 2018-12-09 DIAGNOSIS — S27321A Contusion of lung, unilateral, initial encounter: Secondary | ICD-10-CM | POA: Diagnosis present

## 2018-12-09 DIAGNOSIS — T1490XA Injury, unspecified, initial encounter: Secondary | ICD-10-CM | POA: Diagnosis present

## 2018-12-09 DIAGNOSIS — K5901 Slow transit constipation: Secondary | ICD-10-CM | POA: Diagnosis not present

## 2018-12-09 DIAGNOSIS — Z9884 Bariatric surgery status: Secondary | ICD-10-CM | POA: Diagnosis not present

## 2018-12-09 DIAGNOSIS — S42102A Fracture of unspecified part of scapula, left shoulder, initial encounter for closed fracture: Secondary | ICD-10-CM | POA: Diagnosis present

## 2018-12-09 DIAGNOSIS — S51012A Laceration without foreign body of left elbow, initial encounter: Secondary | ICD-10-CM | POA: Diagnosis present

## 2018-12-09 DIAGNOSIS — S42295A Other nondisplaced fracture of upper end of left humerus, initial encounter for closed fracture: Secondary | ICD-10-CM | POA: Diagnosis present

## 2018-12-09 DIAGNOSIS — S0101XA Laceration without foreign body of scalp, initial encounter: Secondary | ICD-10-CM | POA: Diagnosis present

## 2018-12-09 DIAGNOSIS — Z91040 Latex allergy status: Secondary | ICD-10-CM | POA: Diagnosis not present

## 2018-12-09 DIAGNOSIS — S42202S Unspecified fracture of upper end of left humerus, sequela: Secondary | ICD-10-CM | POA: Diagnosis not present

## 2018-12-09 DIAGNOSIS — I10 Essential (primary) hypertension: Secondary | ICD-10-CM | POA: Diagnosis present

## 2018-12-09 DIAGNOSIS — S22051A Stable burst fracture of T5-T6 vertebra, initial encounter for closed fracture: Secondary | ICD-10-CM | POA: Diagnosis present

## 2018-12-09 DIAGNOSIS — Z23 Encounter for immunization: Secondary | ICD-10-CM | POA: Diagnosis not present

## 2018-12-09 DIAGNOSIS — E039 Hypothyroidism, unspecified: Secondary | ICD-10-CM | POA: Diagnosis present

## 2018-12-09 DIAGNOSIS — Z20828 Contact with and (suspected) exposure to other viral communicable diseases: Secondary | ICD-10-CM | POA: Diagnosis present

## 2018-12-09 DIAGNOSIS — S2241XA Multiple fractures of ribs, right side, initial encounter for closed fracture: Secondary | ICD-10-CM | POA: Diagnosis present

## 2018-12-09 DIAGNOSIS — F419 Anxiety disorder, unspecified: Secondary | ICD-10-CM | POA: Diagnosis present

## 2018-12-09 DIAGNOSIS — I7300 Raynaud's syndrome without gangrene: Secondary | ICD-10-CM | POA: Diagnosis present

## 2018-12-09 DIAGNOSIS — F339 Major depressive disorder, recurrent, unspecified: Secondary | ICD-10-CM | POA: Diagnosis not present

## 2018-12-09 DIAGNOSIS — D62 Acute posthemorrhagic anemia: Secondary | ICD-10-CM | POA: Diagnosis not present

## 2018-12-09 DIAGNOSIS — Z7901 Long term (current) use of anticoagulants: Secondary | ICD-10-CM | POA: Diagnosis not present

## 2018-12-09 DIAGNOSIS — Z91030 Bee allergy status: Secondary | ICD-10-CM | POA: Diagnosis not present

## 2018-12-09 DIAGNOSIS — Z881 Allergy status to other antibiotic agents status: Secondary | ICD-10-CM | POA: Diagnosis not present

## 2018-12-09 DIAGNOSIS — G8918 Other acute postprocedural pain: Secondary | ICD-10-CM | POA: Diagnosis not present

## 2018-12-09 DIAGNOSIS — S2243XS Multiple fractures of ribs, bilateral, sequela: Secondary | ICD-10-CM | POA: Diagnosis not present

## 2018-12-09 DIAGNOSIS — S22050S Wedge compression fracture of T5-T6 vertebra, sequela: Secondary | ICD-10-CM | POA: Diagnosis not present

## 2018-12-09 DIAGNOSIS — G4733 Obstructive sleep apnea (adult) (pediatric): Secondary | ICD-10-CM | POA: Diagnosis present

## 2018-12-09 DIAGNOSIS — T07XXXA Unspecified multiple injuries, initial encounter: Secondary | ICD-10-CM | POA: Diagnosis not present

## 2018-12-09 DIAGNOSIS — I480 Paroxysmal atrial fibrillation: Secondary | ICD-10-CM | POA: Diagnosis present

## 2018-12-09 DIAGNOSIS — S22050A Wedge compression fracture of T5-T6 vertebra, initial encounter for closed fracture: Secondary | ICD-10-CM | POA: Diagnosis not present

## 2018-12-09 DIAGNOSIS — Z79890 Hormone replacement therapy (postmenopausal): Secondary | ICD-10-CM | POA: Diagnosis not present

## 2018-12-09 DIAGNOSIS — Z6841 Body Mass Index (BMI) 40.0 and over, adult: Secondary | ICD-10-CM | POA: Diagnosis not present

## 2018-12-09 LAB — BASIC METABOLIC PANEL
Anion gap: 9 (ref 5–15)
BUN: 10 mg/dL (ref 6–20)
CO2: 23 mmol/L (ref 22–32)
Calcium: 8.1 mg/dL — ABNORMAL LOW (ref 8.9–10.3)
Chloride: 106 mmol/L (ref 98–111)
Creatinine, Ser: 0.97 mg/dL (ref 0.44–1.00)
GFR calc Af Amer: >60 mL/min (ref >60)
GFR calc non Af Amer: >60 mL/min (ref >60)
Glucose, Bld: 146 mg/dL — ABNORMAL HIGH (ref 70–99)
Potassium: 3.5 mmol/L (ref 3.5–5.1)
Sodium: 138 mmol/L (ref 135–145)

## 2018-12-09 LAB — SARS CORONAVIRUS 2 (TAT 6-24 HRS): SARS Coronavirus 2: NEGATIVE

## 2018-12-09 LAB — HIV ANTIBODY (ROUTINE TESTING W REFLEX): HIV Screen 4th Generation wRfx: NONREACTIVE

## 2018-12-09 LAB — CBC
HCT: 32.9 % — ABNORMAL LOW (ref 36.0–46.0)
Hemoglobin: 10.8 g/dL — ABNORMAL LOW (ref 12.0–15.0)
MCH: 33.8 pg (ref 26.0–34.0)
MCHC: 32.8 g/dL (ref 30.0–36.0)
MCV: 102.8 fL — ABNORMAL HIGH (ref 80.0–100.0)
Platelets: 251 10*3/uL (ref 150–400)
RBC: 3.2 MIL/uL — ABNORMAL LOW (ref 3.87–5.11)
RDW: 13.1 % (ref 11.5–15.5)
WBC: 10.4 10*3/uL (ref 4.0–10.5)
nRBC: 0 % (ref 0.0–0.2)

## 2018-12-09 LAB — CDS SEROLOGY

## 2018-12-09 MED ORDER — OXYCODONE HCL 5 MG PO TABS
10.0000 mg | ORAL_TABLET | ORAL | Status: DC | PRN
  Administered 2018-12-09 – 2018-12-16 (×22): 10 mg via ORAL
  Filled 2018-12-09 (×22): qty 2

## 2018-12-09 MED ORDER — PANTOPRAZOLE SODIUM 40 MG IV SOLR: 40.0000 mg | Freq: Every day | INTRAVENOUS | Status: DC

## 2018-12-09 MED ORDER — MORPHINE SULFATE (PF) 2 MG/ML IV SOLN
1.0000 mg | INTRAVENOUS | Status: DC | PRN
  Administered 2018-12-09: 2 mg via INTRAVENOUS
  Filled 2018-12-09: qty 1

## 2018-12-09 MED ORDER — ALBUTEROL SULFATE (2.5 MG/3ML) 0.083% IN NEBU: 2.5000 mg | INHALATION_SOLUTION | Freq: Four times a day (QID) | RESPIRATORY_TRACT | Status: DC | PRN

## 2018-12-09 MED ORDER — ALBUTEROL SULFATE HFA 108 (90 BASE) MCG/ACT IN AERS
2.0000 | INHALATION_SPRAY | Freq: Four times a day (QID) | RESPIRATORY_TRACT | Status: DC | PRN
  Filled 2018-12-09: qty 6.7

## 2018-12-09 MED ORDER — FENTANYL CITRATE (PF) 100 MCG/2ML IJ SOLN
INTRAMUSCULAR | Status: AC
  Filled 2018-12-09: qty 2

## 2018-12-09 MED ORDER — FENTANYL CITRATE (PF) 100 MCG/2ML IJ SOLN
50.0000 ug | Freq: Once | INTRAMUSCULAR | Status: AC
  Administered 2018-12-09: 50 ug via INTRAVENOUS

## 2018-12-09 MED ORDER — ALPRAZOLAM 0.5 MG PO TABS
0.5000 mg | ORAL_TABLET | Freq: Every evening | ORAL | Status: DC | PRN
  Administered 2018-12-09: 0.5 mg via ORAL
  Filled 2018-12-09: qty 2

## 2018-12-09 MED ORDER — FLUOXETINE HCL 20 MG PO CAPS
40.0000 mg | ORAL_CAPSULE | Freq: Every day | ORAL | Status: DC
  Administered 2018-12-09 – 2018-12-16 (×8): 40 mg via ORAL
  Filled 2018-12-09 (×9): qty 2

## 2018-12-09 MED ORDER — ENOXAPARIN SODIUM 40 MG/0.4ML ~~LOC~~ SOLN
40.0000 mg | SUBCUTANEOUS | Status: DC
  Administered 2018-12-10: 09:00:00 40 mg via SUBCUTANEOUS
  Filled 2018-12-09: qty 0.4

## 2018-12-09 MED ORDER — BISACODYL 10 MG RE SUPP: 10.0000 mg | Freq: Every day | RECTAL | Status: DC | PRN

## 2018-12-09 MED ORDER — PANTOPRAZOLE SODIUM 40 MG PO TBEC
40.0000 mg | DELAYED_RELEASE_TABLET | Freq: Every day | ORAL | Status: DC
  Administered 2018-12-09 – 2018-12-16 (×8): 40 mg via ORAL
  Filled 2018-12-09 (×8): qty 1

## 2018-12-09 MED ORDER — GABAPENTIN 300 MG PO CAPS
300.0000 mg | ORAL_CAPSULE | Freq: Three times a day (TID) | ORAL | Status: DC
  Administered 2018-12-09 – 2018-12-13 (×13): 300 mg via ORAL
  Filled 2018-12-09 (×13): qty 1

## 2018-12-09 MED ORDER — METHOCARBAMOL 750 MG PO TABS
750.0000 mg | ORAL_TABLET | Freq: Three times a day (TID) | ORAL | Status: DC
  Administered 2018-12-09 – 2018-12-12 (×11): 750 mg via ORAL
  Filled 2018-12-09 (×11): qty 1

## 2018-12-09 MED ORDER — HYDROMORPHONE HCL 1 MG/ML IJ SOLN
1.0000 mg | INTRAMUSCULAR | Status: DC | PRN
  Administered 2018-12-09: 1 mg via INTRAVENOUS
  Filled 2018-12-09: qty 1

## 2018-12-09 MED ORDER — LEVOTHYROXINE SODIUM 50 MCG PO TABS
50.0000 ug | ORAL_TABLET | Freq: Every day | ORAL | Status: DC
  Administered 2018-12-09 – 2018-12-16 (×7): 50 ug via ORAL
  Filled 2018-12-09 (×7): qty 1

## 2018-12-09 MED ORDER — ACETAMINOPHEN 500 MG PO TABS
1000.0000 mg | ORAL_TABLET | Freq: Four times a day (QID) | ORAL | Status: DC
  Administered 2018-12-09 – 2018-12-16 (×25): 1000 mg via ORAL
  Filled 2018-12-09 (×26): qty 2

## 2018-12-09 MED ORDER — KCL IN DEXTROSE-NACL 20-5-0.45 MEQ/L-%-% IV SOLN
INTRAVENOUS | Status: DC
  Administered 2018-12-09 – 2018-12-12 (×6): via INTRAVENOUS
  Filled 2018-12-09 (×6): qty 1000

## 2018-12-09 MED ORDER — DOCUSATE SODIUM 100 MG PO CAPS
100.0000 mg | ORAL_CAPSULE | Freq: Two times a day (BID) | ORAL | Status: DC
  Administered 2018-12-09 – 2018-12-16 (×15): 100 mg via ORAL
  Filled 2018-12-09 (×15): qty 1

## 2018-12-09 MED ORDER — ONDANSETRON HCL 4 MG/2ML IJ SOLN
4.0000 mg | Freq: Four times a day (QID) | INTRAMUSCULAR | Status: DC | PRN
  Administered 2018-12-12: 4 mg via INTRAVENOUS
  Filled 2018-12-09: qty 2

## 2018-12-09 MED ORDER — METOPROLOL SUCCINATE ER 25 MG PO TB24
25.0000 mg | ORAL_TABLET | Freq: Two times a day (BID) | ORAL | Status: DC
  Administered 2018-12-09 – 2018-12-16 (×14): 25 mg via ORAL
  Filled 2018-12-09 (×15): qty 1

## 2018-12-09 MED ORDER — TRAZODONE HCL 50 MG PO TABS
50.0000 mg | ORAL_TABLET | Freq: Every evening | ORAL | Status: DC | PRN
  Administered 2018-12-15 (×2): 50 mg via ORAL
  Filled 2018-12-09 (×2): qty 1

## 2018-12-09 MED ORDER — HYDROMORPHONE HCL 1 MG/ML IJ SOLN
0.5000 mg | INTRAMUSCULAR | Status: DC | PRN
  Administered 2018-12-09 – 2018-12-10 (×3): 2 mg via INTRAVENOUS
  Administered 2018-12-11 (×2): 1 mg via INTRAVENOUS
  Administered 2018-12-12: 2 mg via INTRAVENOUS
  Administered 2018-12-12 (×3): 1 mg via INTRAVENOUS
  Administered 2018-12-12 – 2018-12-13 (×3): 2 mg via INTRAVENOUS
  Filled 2018-12-09 (×2): qty 1
  Filled 2018-12-09: qty 2
  Filled 2018-12-09: qty 1
  Filled 2018-12-09: qty 2
  Filled 2018-12-09: qty 1
  Filled 2018-12-09 (×2): qty 2
  Filled 2018-12-09: qty 1
  Filled 2018-12-09 (×4): qty 2

## 2018-12-09 MED ORDER — OXYCODONE HCL 5 MG PO TABS
5.0000 mg | ORAL_TABLET | ORAL | Status: DC | PRN
  Filled 2018-12-09 (×2): qty 1

## 2018-12-09 MED ORDER — ONDANSETRON 4 MG PO TBDP: 4.0000 mg | ORAL_TABLET | Freq: Four times a day (QID) | ORAL | Status: DC | PRN
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
## 2018-12-09 NOTE — ED Notes (Signed)
Assisted pt to call her mother and brother

## 2018-12-09 NOTE — ED Provider Notes (Signed)
..  Laceration Repair  Date/Time: 12/09/2018 6:04 AM Performed by: Abigail Butts, PA-C Authorized by: Abigail Butts, PA-C   Consent:    Consent obtained:  Verbal   Consent given by:  Patient   Risks discussed:  Infection, need for additional repair, pain, poor cosmetic result and poor wound healing   Alternatives discussed:  No treatment and delayed treatment Universal protocol:    Procedure explained and questions answered to patient or proxy's satisfaction: yes     Relevant documents present and verified: yes     Test results available and properly labeled: yes     Imaging studies available: yes     Required blood products, implants, devices, and special equipment available: yes     Site/side marked: yes     Immediately prior to procedure, a time out was called: yes     Patient identity confirmed:  Verbally with patient Anesthesia (see MAR for exact dosages):    Anesthesia method:  Local infiltration   Local anesthetic:  Lidocaine 2% WITH epi Laceration details:    Location:  Scalp   Scalp location:  Occipital   Length (cm):  3 Repair type:    Repair type:  Intermediate Pre-procedure details:    Preparation:  Patient was prepped and draped in usual sterile fashion Exploration:    Hemostasis achieved with:  Epinephrine and direct pressure   Wound exploration: entire depth of wound probed and visualized   Treatment:    Area cleansed with:  Saline   Amount of cleaning:  Standard   Irrigation solution:  Sterile water Skin repair:    Repair method:  Staples   Number of staples:  4 Approximation:    Approximation:  Close Post-procedure details:    Dressing:  Open (no dressing)   Patient tolerance of procedure:  Tolerated well, no immediate complications     Agapito Games 12/09/18 0604    Ezequiel Essex, MD 12/09/18 [id number]

## 2018-12-09 NOTE — Progress Notes (Signed)
Patient to go to CT and requested pain med at 1130 before transport to CT. Vital Signs stable. Spo2 97% after medication. Brother present in room. Simmie Davies Rn
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
## 2018-12-09 NOTE — ED Notes (Addendum)
Ortho paged. Patient resting quietly.
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
## 2018-12-09 NOTE — Progress Notes (Signed)
Orthopedic Tech Progress Note Patient Details:  [name] 02-20-1966 [id number] Called in order to [employer] for a TLSO brace Patient ID: [name], female   DOB: 04-05-1965, 53 y.o.   MRN: [id number]   Janit Pagan 12/09/2018, 8:46 AM

## 2018-12-10 ENCOUNTER — Inpatient Hospital Stay (HOSPITAL_COMMUNITY): Payer: Managed Care, Other (non HMO)

## 2018-12-10 LAB — CBC
HCT: 30.9 % — ABNORMAL LOW (ref 36.0–46.0)
Hemoglobin: 10 g/dL — ABNORMAL LOW (ref 12.0–15.0)
MCH: 33.7 pg (ref 26.0–34.0)
MCHC: 32.4 g/dL (ref 30.0–36.0)
MCV: 104 fL — ABNORMAL HIGH (ref 80.0–100.0)
Platelets: 223 10*3/uL (ref 150–400)
RBC: 2.97 MIL/uL — ABNORMAL LOW (ref 3.87–5.11)
RDW: 13.2 % (ref 11.5–15.5)
WBC: 7.6 10*3/uL (ref 4.0–10.5)
nRBC: 0 % (ref 0.0–0.2)

## 2018-12-10 MED ORDER — ENOXAPARIN SODIUM 60 MG/0.6ML ~~LOC~~ SOLN
60.0000 mg | SUBCUTANEOUS | Status: DC
  Administered 2018-12-11 – 2018-12-14 (×3): 60 mg via SUBCUTANEOUS
  Filled 2018-12-10 (×5): qty 0.6
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
## 2018-12-10 NOTE — Progress Notes (Signed)
CSW met with patient to engage in SBIRT per trauma protocol. Patient scored a total of 0 on their SBIRT exam. CSW provided psychoeducation on substance use and offered patient with resources as appropriate. CSW provided brief intervention to patient discussing motivation to maintain abstinence from illicit use. Patient reported no known/significant history of alcohol use Patient responded openly/appropriate to discussions of substance use.   Lamonte Richer, LCSW, [place] Worker II [phone]
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
## 2018-12-10 NOTE — Progress Notes (Signed)
Rehab Admissions Coordinator Note:  Patient was screened by Michel Santee for appropriateness for an Inpatient Acute Rehab Consult.  At this time, we are recommending Inpatient Rehab consult.  Michel Santee 12/10/2018, 3:51 PM  I can be reached at [phone].

## 2018-12-11 LAB — BASIC METABOLIC PANEL
Anion gap: 9 (ref 5–15)
BUN: 7 mg/dL (ref 6–20)
CO2: 27 mmol/L (ref 22–32)
Calcium: 8 mg/dL — ABNORMAL LOW (ref 8.9–10.3)
Chloride: 99 mmol/L (ref 98–111)
Creatinine, Ser: 0.73 mg/dL (ref 0.44–1.00)
GFR calc Af Amer: >60 mL/min (ref >60)
GFR calc non Af Amer: >60 mL/min (ref >60)
Glucose, Bld: 117 mg/dL — ABNORMAL HIGH (ref 70–99)
Potassium: 4 mmol/L (ref 3.5–5.1)
Sodium: 135 mmol/L (ref 135–145)
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
## 2018-12-12 ENCOUNTER — Inpatient Hospital Stay (HOSPITAL_COMMUNITY): Payer: Managed Care, Other (non HMO)

## 2018-12-12 ENCOUNTER — Inpatient Hospital Stay (HOSPITAL_COMMUNITY): Payer: Managed Care, Other (non HMO) | Admitting: Anesthesiology

## 2018-12-12 ENCOUNTER — Encounter (HOSPITAL_COMMUNITY): Payer: Self-pay | Admitting: Certified Registered Nurse Anesthetist

## 2018-12-12 ENCOUNTER — Encounter (HOSPITAL_COMMUNITY): Admission: EM | Disposition: A | Discharge status: Rehabilitation Facility | Payer: Self-pay | Source: Home / Self Care

## 2018-12-12 HISTORY — PX: ORIF HUMERUS FRACTURE: SHX2126

## 2018-12-12 LAB — SURGICAL PCR SCREEN
MRSA, PCR: NEGATIVE
Staphylococcus aureus: NEGATIVE

## 2018-12-12 SURGERY — OPEN REDUCTION INTERNAL FIXATION (ORIF) PROXIMAL HUMERUS FRACTURE
Anesthesia: General | Site: Shoulder | Laterality: Left

## 2018-12-12 MED ORDER — LIDOCAINE 2% (20 MG/ML) 5 ML SYRINGE
INTRAMUSCULAR | Status: DC | PRN
  Administered 2018-12-12: 20 mg via INTRAVENOUS

## 2018-12-12 MED ORDER — EPHEDRINE SULFATE 50 MG/ML IJ SOLN
INTRAMUSCULAR | Status: DC | PRN
  Administered 2018-12-12 (×2): 10 mg via INTRAVENOUS

## 2018-12-12 MED ORDER — PROMETHAZINE HCL 25 MG/ML IJ SOLN: 6.2500 mg | INTRAMUSCULAR | Status: DC | PRN

## 2018-12-12 MED ORDER — ROCURONIUM BROMIDE 10 MG/ML (PF) SYRINGE
PREFILLED_SYRINGE | INTRAVENOUS | Status: AC
  Filled 2018-12-12: qty 10

## 2018-12-12 MED ORDER — CLINDAMYCIN PHOSPHATE 600 MG/50ML IV SOLN
600.0000 mg | Freq: Four times a day (QID) | INTRAVENOUS | Status: AC
  Administered 2018-12-12 – 2018-12-13 (×3): 600 mg via INTRAVENOUS
  Filled 2018-12-12 (×3): qty 50

## 2018-12-12 MED ORDER — PHENYLEPHRINE 40 MCG/ML (10ML) SYRINGE FOR IV PUSH (FOR BLOOD PRESSURE SUPPORT)
PREFILLED_SYRINGE | INTRAVENOUS | Status: DC | PRN
  Administered 2018-12-12 (×2): 80 ug via INTRAVENOUS

## 2018-12-12 MED ORDER — VANCOMYCIN HCL 1000 MG IV SOLR
INTRAVENOUS | Status: DC | PRN
  Administered 2018-12-12: 1000 mg

## 2018-12-12 MED ORDER — HYDROMORPHONE HCL 1 MG/ML IJ SOLN
0.2500 mg | INTRAMUSCULAR | Status: DC | PRN
  Administered 2018-12-12 (×2): 0.5 mg via INTRAVENOUS

## 2018-12-12 MED ORDER — FENTANYL CITRATE (PF) 250 MCG/5ML IJ SOLN
INTRAMUSCULAR | Status: DC | PRN
  Administered 2018-12-12: 50 ug via INTRAVENOUS

## 2018-12-12 MED ORDER — ALBUMIN HUMAN 5 % IV SOLN
INTRAVENOUS | Status: DC | PRN
  Administered 2018-12-12: 13:00:00 via INTRAVENOUS

## 2018-12-12 MED ORDER — OXYCODONE HCL 5 MG/5ML PO SOLN: 5.0000 mg | Freq: Once | ORAL | Status: DC | PRN

## 2018-12-12 MED ORDER — ONDANSETRON HCL 4 MG/2ML IJ SOLN
INTRAMUSCULAR | Status: AC
  Filled 2018-12-12: qty 2

## 2018-12-12 MED ORDER — PROPOFOL 10 MG/ML IV BOLUS
INTRAVENOUS | Status: DC | PRN
  Administered 2018-12-12: 100 mg via INTRAVENOUS

## 2018-12-12 MED ORDER — ONDANSETRON HCL 4 MG/2ML IJ SOLN
INTRAMUSCULAR | Status: AC
  Filled 2018-12-12: qty 4

## 2018-12-12 MED ORDER — DEXAMETHASONE SODIUM PHOSPHATE 10 MG/ML IJ SOLN
INTRAMUSCULAR | Status: DC | PRN
  Administered 2018-12-12: 5 mg via INTRAVENOUS

## 2018-12-12 MED ORDER — LIDOCAINE 2% (20 MG/ML) 5 ML SYRINGE
INTRAMUSCULAR | Status: AC
  Filled 2018-12-12: qty 5

## 2018-12-12 MED ORDER — ROCURONIUM BROMIDE 10 MG/ML (PF) SYRINGE
PREFILLED_SYRINGE | INTRAVENOUS | Status: DC | PRN
  Administered 2018-12-12: 60 mg via INTRAVENOUS
  Administered 2018-12-12 (×2): 20 mg via INTRAVENOUS

## 2018-12-12 MED ORDER — CLINDAMYCIN PHOSPHATE 900 MG/50ML IV SOLN
900.0000 mg | INTRAVENOUS | Status: AC
  Administered 2018-12-12: 900 mg via INTRAVENOUS

## 2018-12-12 MED ORDER — ACETAMINOPHEN 10 MG/ML IV SOLN: 1000.0000 mg | Freq: Once | INTRAVENOUS | Status: DC | PRN

## 2018-12-12 MED ORDER — MEPERIDINE HCL 25 MG/ML IJ SOLN: 6.2500 mg | INTRAMUSCULAR | Status: DC | PRN

## 2018-12-12 MED ORDER — FENTANYL CITRATE (PF) 250 MCG/5ML IJ SOLN
INTRAMUSCULAR | Status: AC
  Filled 2018-12-12: qty 5

## 2018-12-12 MED ORDER — MIDAZOLAM HCL 2 MG/2ML IJ SOLN
INTRAMUSCULAR | Status: AC
  Filled 2018-12-12: qty 2

## 2018-12-12 MED ORDER — FENTANYL CITRATE (PF) 100 MCG/2ML IJ SOLN
100.0000 ug | Freq: Once | INTRAMUSCULAR | Status: AC
  Administered 2018-12-12: 12:00:00 100 ug via INTRAVENOUS

## 2018-12-12 MED ORDER — VANCOMYCIN HCL 1000 MG IV SOLR
INTRAVENOUS | Status: AC
  Filled 2018-12-12: qty 1000

## 2018-12-12 MED ORDER — 0.9 % SODIUM CHLORIDE (POUR BTL) OPTIME
TOPICAL | Status: DC | PRN
  Administered 2018-12-12: 13:00:00 1000 mL

## 2018-12-12 MED ORDER — CLINDAMYCIN PHOSPHATE 900 MG/50ML IV SOLN
INTRAVENOUS | Status: AC
  Filled 2018-12-12: qty 50

## 2018-12-12 MED ORDER — MIDAZOLAM HCL 2 MG/2ML IJ SOLN
2.0000 mg | Freq: Once | INTRAMUSCULAR | Status: AC
  Administered 2018-12-12: 12:00:00 2 mg via INTRAVENOUS

## 2018-12-12 MED ORDER — SUGAMMADEX SODIUM 200 MG/2ML IV SOLN
INTRAVENOUS | Status: DC | PRN
  Administered 2018-12-12: 100 mg via INTRAVENOUS
  Administered 2018-12-12: 400 mg via INTRAVENOUS

## 2018-12-12 MED ORDER — SODIUM CHLORIDE 0.9 % IV SOLN
INTRAVENOUS | Status: DC | PRN
  Administered 2018-12-12: 40 ug/min via INTRAVENOUS

## 2018-12-12 MED ORDER — ROPIVACAINE HCL 5 MG/ML IJ SOLN
INTRAMUSCULAR | Status: DC | PRN
  Administered 2018-12-12: 30 mL via PERINEURAL

## 2018-12-12 MED ORDER — LACTATED RINGERS IV SOLN
INTRAVENOUS | Status: DC
  Administered 2018-12-12 (×2): via INTRAVENOUS

## 2018-12-12 MED ORDER — MIDAZOLAM HCL 2 MG/2ML IJ SOLN
INTRAMUSCULAR | Status: AC
  Administered 2018-12-12: 2 mg via INTRAVENOUS
  Filled 2018-12-12: qty 2

## 2018-12-12 MED ORDER — FENTANYL CITRATE (PF) 100 MCG/2ML IJ SOLN
INTRAMUSCULAR | Status: AC
  Administered 2018-12-12: 100 ug via INTRAVENOUS
  Filled 2018-12-12: qty 2

## 2018-12-12 MED ORDER — HYDROMORPHONE HCL 1 MG/ML IJ SOLN
INTRAMUSCULAR | Status: AC
  Administered 2018-12-12: 0.5 mg via INTRAVENOUS
  Filled 2018-12-12: qty 1

## 2018-12-12 MED ORDER — DEXAMETHASONE SODIUM PHOSPHATE 10 MG/ML IJ SOLN
INTRAMUSCULAR | Status: AC
  Filled 2018-12-12: qty 1

## 2018-12-12 MED ORDER — CHLORHEXIDINE GLUCONATE 4 % EX LIQD: 60.0000 mL | Freq: Once | CUTANEOUS | Status: DC

## 2018-12-12 MED ORDER — CLONIDINE HCL (ANALGESIA) 100 MCG/ML EP SOLN
EPIDURAL | Status: DC | PRN
  Administered 2018-12-12: 100 ug

## 2018-12-12 MED ORDER — OXYCODONE HCL 5 MG PO TABS: 5.0000 mg | ORAL_TABLET | Freq: Once | ORAL | Status: DC | PRN

## 2018-12-12 SURGICAL SUPPLY — 73 items
AID PSTN UNV HD RSTRNT DISP (MISCELLANEOUS) ×1
APL PRP STRL LF DISP 70% ISPRP (MISCELLANEOUS) ×3
BIT DRILL 3.2 (BIT) ×3
BIT DRILL 3.2XCALB NS DISP (BIT) IMPLANT
BIT DRILL CALIBRATED 2.7 (BIT) ×1 IMPLANT
BIT DRILL CALIBRATED 2.7MM (BIT) ×1
BIT DRL 3.2XCALB NS DISP (BIT) ×1
BLADE 10 SAFETY STRL DISP (BLADE) ×4 IMPLANT
CHLORAPREP W/TINT 26 (MISCELLANEOUS) ×7 IMPLANT
CLOSURE WOUND 1/2 X4 (GAUZE/BANDAGES/DRESSINGS) ×1
COVER SURGICAL LIGHT HANDLE (MISCELLANEOUS) ×4 IMPLANT
COVER WAND RF STERILE (DRAPES) ×3 IMPLANT
DRAPE C-ARM 42X72 X-RAY (DRAPES) ×3 IMPLANT
DRAPE HALF SHEET 40X57 (DRAPES) ×3 IMPLANT
DRAPE IMP U-DRAPE 54X76 (DRAPES) ×3 IMPLANT
DRAPE INCISE IOBAN 66X45 STRL (DRAPES) ×3 IMPLANT
DRAPE ORTHO SPLIT 77X108 STRL (DRAPES) ×6
DRAPE SURG 17X23 STRL (DRAPES) ×3 IMPLANT
DRAPE SURG ORHT 6 SPLT 77X108 (DRAPES) ×2 IMPLANT
DRAPE SWITCH (DRAPES) ×2 IMPLANT
DRAPE U-SHAPE 47X51 STRL (DRAPES) ×3 IMPLANT
DRSG AQUACEL AG ADV 3.5X 6 (GAUZE/BANDAGES/DRESSINGS) ×2 IMPLANT
DRSG MEPILEX BORDER 4X8 (GAUZE/BANDAGES/DRESSINGS) IMPLANT
ELECT BLADE 4.0 EZ CLEAN MEGAD (MISCELLANEOUS)
ELECT REM PT RETURN 9FT ADLT (ELECTROSURGICAL) ×3
ELECTRODE BLDE 4.0 EZ CLN MEGD (MISCELLANEOUS) IMPLANT
ELECTRODE REM PT RTRN 9FT ADLT (ELECTROSURGICAL) ×1 IMPLANT
GAUZE SPONGE 4X4 12PLY STRL (GAUZE/BANDAGES/DRESSINGS) IMPLANT
GLOVE BIOGEL PI IND STRL 8 (GLOVE) ×1 IMPLANT
GLOVE BIOGEL PI INDICATOR 8 (GLOVE) ×2
GLOVE ECLIPSE 8.0 STRL XLNG CF (GLOVE) ×3 IMPLANT
GOWN STRL REUS W/ TWL LRG LVL3 (GOWN DISPOSABLE) ×3 IMPLANT
GOWN STRL REUS W/ TWL XL LVL3 (GOWN DISPOSABLE) ×1 IMPLANT
GOWN STRL REUS W/TWL LRG LVL3 (GOWN DISPOSABLE) ×9
GOWN STRL REUS W/TWL XL LVL3 (GOWN DISPOSABLE) ×3
K-WIRE 2X5 SS THRDED S3 (WIRE) ×6
KIT BASIN OR (CUSTOM PROCEDURE TRAY) ×3 IMPLANT
KIT STABILIZATION SHOULDER (MISCELLANEOUS) ×2 IMPLANT
KIT TURNOVER KIT B (KITS) ×3 IMPLANT
KWIRE 2X5 SS THRDED S3 (WIRE) IMPLANT
MANIFOLD NEPTUNE II (INSTRUMENTS) ×3 IMPLANT
NDL SUT .5 MAYO 1.404X.05X (NEEDLE) ×1 IMPLANT
NDL SUT 2 .5 CRC MAYO 1.732X (NEEDLE) ×1 IMPLANT
NEEDLE 22X1 1/2 (OR ONLY) (NEEDLE) IMPLANT
NEEDLE MAYO TAPER (NEEDLE) ×6
NS IRRIG 1000ML POUR BTL (IV SOLUTION) ×3 IMPLANT
PACK SHOULDER (CUSTOM PROCEDURE TRAY) ×3 IMPLANT
PAD ARMBOARD 7.5X6 YLW CONV (MISCELLANEOUS) ×6 IMPLANT
PLATE HUMERUS LP PROX L 3H (Plate) ×2 IMPLANT
RESTRAINT HEAD UNIVERSAL NS (MISCELLANEOUS) ×3 IMPLANT
SCREW LOCK CANC STAR 4X28 (Screw) ×4 IMPLANT
SCREW LOCK CANC STAR 4X36 (Screw) ×4 IMPLANT
SCREW LOCK CANC STAR 4X42 (Screw) ×4 IMPLANT
SCREW LOCK CANC STAR 4X48 (Screw) ×2 IMPLANT
SCREW LOCK CORT STAR 3.5X30 (Screw) ×4 IMPLANT
SCREW LOW PROFILE 3.5X30MM TIS (Screw) ×2 IMPLANT
SPONGE LAP 18X18 RF (DISPOSABLE) ×6 IMPLANT
STAPLER VISISTAT 35W (STAPLE) ×3 IMPLANT
STRIP CLOSURE SKIN 1/2X4 (GAUZE/BANDAGES/DRESSINGS) ×2 IMPLANT
SUCTION FRAZIER HANDLE 10FR (MISCELLANEOUS) ×2
SUCTION TUBE FRAZIER 10FR DISP (MISCELLANEOUS) ×1 IMPLANT
SUT BROADBAND TAPE 2PK 2.3 (SUTURE) ×2 IMPLANT
SUT MON AB 3-0 SH 27 (SUTURE) ×3
SUT MON AB 3-0 SH27 (SUTURE) IMPLANT
SUT VIC AB 2-0 CT1 27 (SUTURE) ×6
SUT VIC AB 2-0 CT1 TAPERPNT 27 (SUTURE) ×2 IMPLANT
SYR CONTROL 10ML LL (SYRINGE) IMPLANT
TOWEL GREEN STERILE (TOWEL DISPOSABLE) ×3 IMPLANT
TOWEL GREEN STERILE FF (TOWEL DISPOSABLE) ×3 IMPLANT
TUBE CONNECTING 12'X1/4 (SUCTIONS) ×1
TUBE CONNECTING 12X1/4 (SUCTIONS) ×2 IMPLANT
WATER STERILE IRR 1000ML POUR (IV SOLUTION) ×3 IMPLANT
YANKAUER SUCT BULB TIP NO VENT (SUCTIONS) ×3 IMPLANT

## 2018-12-12 NOTE — Progress Notes (Signed)
PT Cancellation Note  Patient Details Name: [name] MRN: [id number] DOB: 1965/09/24   Cancelled Treatment:    Reason Eval/Treat Not Completed: Patient at procedure or test/unavailable. Pt in OR for ORIF LUE.   Lorriane Shire 12/12/2018, 11:23 AM   Lorrin Goodell, PT  Office # [phone] Pager [phone]

## 2018-12-12 NOTE — Anesthesia Preprocedure Evaluation (Addendum)
Anesthesia Evaluation  Patient identified by MRN, date of birth, ID band Patient awake    Reviewed: Allergy & Precautions, NPO status , Patient's Chart, lab work & pertinent test results  Airway Mallampati: III  TM Distance: >3 FB Neck ROM: Full    Dental no notable dental hx. (+) Teeth Intact   Pulmonary sleep apnea and Continuous Positive Airway Pressure Ventilation ,    Pulmonary exam normal breath sounds clear to auscultation       Cardiovascular hypertension, Pt. on medications Normal cardiovascular exam+ dysrhythmias Atrial Fibrillation  Rhythm:Regular Rate:Normal     Neuro/Psych PSYCHIATRIC DISORDERS Depression    GI/Hepatic Neg liver ROS, GERD  Medicated,  Endo/Other  negative endocrine ROS  Renal/GU negative Renal ROS     Musculoskeletal negative musculoskeletal ROS (+)   Abdominal (+) + obese,   Peds  Hematology Hgb 10.0   Anesthesia Other Findings   Reproductive/Obstetrics negative OB ROS                            Anesthesia Physical Anesthesia Plan  ASA: III  Anesthesia Plan: General   Post-op Pain Management: GA combined w/ Regional for post-op pain   Induction: Intravenous  PONV Risk Score and Plan: 3 and Treatment may vary due to age or medical condition, Ondansetron and Dexamethasone  Airway Management Planned: Oral ETT  Additional Equipment: None  Intra-op Plan:   Post-operative Plan: Extubation in OR  Informed Consent: I have reviewed the patients History and Physical, chart, labs and discussed the procedure including the risks, benefits and alternatives for the proposed anesthesia with the patient or authorized representative who has indicated his/her understanding and acceptance.     Dental advisory given  Plan Discussed with: CRNA and Anesthesiologist  Anesthesia Plan Comments: (Ga plus L ISB Block)       Anesthesia Quick Evaluation

## 2018-12-12 NOTE — Transfer of Care (Signed)
Immediate Anesthesia Transfer of Care Note  Patient: [name]  Procedure(s) Performed: OPEN REDUCTION INTERNAL FIXATION (ORIF) LEFT PROXIMAL HUMERUS FRACTURE (Left Shoulder)  Patient Location: PACU  Anesthesia Type:GA combined with regional for post-op pain  Level of Consciousness: awake, alert  and patient cooperative  Airway & Oxygen Therapy: Patient Spontanous Breathing and Patient connected to face mask oxygen  Post-op Assessment: Report given to RN and Post -op Vital signs reviewed and stable  Post vital signs: Reviewed and stable  Last Vitals:  Vitals Value Taken Time  BP 112/63 12/12/18 1428  Temp    Pulse 77 12/12/18 1431  Resp 23 12/12/18 1431  SpO2 99 % 12/12/18 1431  Vitals shown include unvalidated device data.  Last Pain:  Vitals:   12/12/18 0930  TempSrc:   PainSc: 4       Patients Stated Pain Goal: 2 (94/50/38 8828)  Complications: No apparent anesthesia complications

## 2018-12-12 NOTE — Progress Notes (Signed)
Inpatient Rehab Admissions Coordinator:   Note pt to OR today for LUE ORIF.  Will follow for post-surgical therapy recommendations.   Shann Medal, PT, DPT Admissions Coordinator [phone] 12/12/18  12:02 PM

## 2018-12-12 NOTE — Anesthesia Postprocedure Evaluation (Signed)
Anesthesia Post Note  Patient: [name]  Procedure(s) Performed: OPEN REDUCTION INTERNAL FIXATION (ORIF) LEFT PROXIMAL HUMERUS FRACTURE (Left Shoulder)     Patient location during evaluation: PACU Anesthesia Type: General and Regional Level of consciousness: awake and alert, patient cooperative and oriented Pain management: pain level controlled Vital Signs Assessment: post-procedure vital signs reviewed and stable Respiratory status: spontaneous breathing, nonlabored ventilation and respiratory function stable Cardiovascular status: blood pressure returned to baseline and stable Postop Assessment: no apparent nausea or vomiting Anesthetic complications: no    Last Vitals:  Vitals:   12/12/18 1200 12/12/18 1430  BP:    Pulse: 70   Resp: 18   Temp:  36.7 C  SpO2: 98%     Last Pain:  Vitals:   12/12/18 1500  TempSrc:   PainSc: 0-No pain                 Amelianna Meller,E. Deen Deguia
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
## 2018-12-12 NOTE — Anesthesia Procedure Notes (Signed)
Procedure Name: Intubation Date/Time: 12/12/2018 12:16 PM Performed by: Shirlyn Goltz, CRNA Pre-anesthesia Checklist: Patient identified, Emergency Drugs available, Suction available and Patient being monitored Patient Re-evaluated:Patient Re-evaluated prior to induction Oxygen Delivery Method: Circle System Utilized Preoxygenation: Pre-oxygenation with 100% oxygen Induction Type: IV induction Ventilation: Mask ventilation without difficulty Laryngoscope Size: Mac and 3 Grade View: Grade II Tube type: Oral Tube size: 7.0 mm Number of attempts: 1 Airway Equipment and Method: Stylet Placement Confirmation: ETT inserted through vocal cords under direct vision,  positive ETCO2 and breath sounds checked- equal and bilateral Secured at: 22 cm Tube secured with: Tape Dental Injury: Teeth and Oropharynx as per pre-operative assessment
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
## 2018-12-12 NOTE — Anesthesia Procedure Notes (Signed)
Anesthesia Regional Block: Interscalene brachial plexus block   Pre-Anesthetic Checklist: ,, timeout performed, Correct Patient, Correct Site, Correct Laterality, Correct Procedure, Correct Position, site marked, Risks and benefits discussed,  Surgical consent,  Pre-op evaluation,  At surgeon's request and post-op pain management  Laterality: Upper and Left  Prep: Maximum Sterile Barrier Precautions used, chloraprep       Needles:  Injection technique: Single-shot  Needle Type: Echogenic Needle     Needle Length: 5cm  Needle Gauge: 21     Additional Needles:   Procedures:,,,, ultrasound used (permanent image in chart),,,,  Narrative:  Start time: 12/12/2018 11:57 AM End time: 12/12/2018 12:03 PM Injection made incrementally with aspirations every 5 mL.  Performed by: Personally  Anesthesiologist: Barnet Glasgow, MD  Additional Notes: Block assessed prior to procedure. Patient tolerated procedure well.
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
## 2018-12-13 ENCOUNTER — Encounter (HOSPITAL_COMMUNITY): Payer: Self-pay | Admitting: Orthopaedic Surgery

## 2018-12-13 MED ORDER — METHOCARBAMOL 1000 MG/10ML IJ SOLN
500.0000 mg | Freq: Once | INTRAVENOUS | Status: DC
  Filled 2018-12-13: qty 5

## 2018-12-13 MED ORDER — METHOCARBAMOL 500 MG PO TABS
500.0000 mg | ORAL_TABLET | Freq: Once | ORAL | Status: AC
  Administered 2018-12-13: 500 mg via ORAL
  Filled 2018-12-13: qty 1

## 2018-12-13 MED ORDER — METHOCARBAMOL 750 MG PO TABS
750.0000 mg | ORAL_TABLET | Freq: Four times a day (QID) | ORAL | Status: DC
  Administered 2018-12-13 (×4): 750 mg via ORAL
  Filled 2018-12-13 (×3): qty 1

## 2018-12-13 MED ORDER — TRAMADOL HCL 50 MG PO TABS
50.0000 mg | ORAL_TABLET | Freq: Four times a day (QID) | ORAL | Status: DC
  Administered 2018-12-13 – 2018-12-14 (×4): 50 mg via ORAL
  Filled 2018-12-13 (×4): qty 1

## 2018-12-13 MED ORDER — HYDROMORPHONE HCL 1 MG/ML IJ SOLN
1.0000 mg | INTRAMUSCULAR | Status: DC | PRN
  Administered 2018-12-13 (×2): 1 mg via INTRAVENOUS
  Filled 2018-12-13 (×2): qty 1
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
## 2018-12-13 NOTE — Progress Notes (Signed)
Inpatient Rehab Admissions:  Inpatient Rehab Consult received.  I met with patient at the bedside for rehabilitation assessment and to discuss goals and expectations of an inpatient rehab admission.  She is very tearful; pain continues to be high.  We discussed need to be mod I at home as she only has a 53 y/o daughter for support.  Would like to follow for a few more days to allow for pain control and improved mobility.  Signed: Shann Medal, PT, DPT Admissions Coordinator [phone] 12/13/18  4:08 PM
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
## 2018-12-14 MED ORDER — POLYETHYLENE GLYCOL 3350 17 G PO PACK
17.0000 g | PACK | Freq: Two times a day (BID) | ORAL | Status: DC
  Administered 2018-12-14 – 2018-12-16 (×5): 17 g via ORAL
  Filled 2018-12-14 (×5): qty 1

## 2018-12-14 MED ORDER — GABAPENTIN 400 MG PO CAPS
400.0000 mg | ORAL_CAPSULE | Freq: Three times a day (TID) | ORAL | Status: DC
  Administered 2018-12-14 – 2018-12-16 (×8): 400 mg via ORAL
  Filled 2018-12-14 (×8): qty 1

## 2018-12-14 MED ORDER — METHOCARBAMOL 500 MG PO TABS
1000.0000 mg | ORAL_TABLET | Freq: Four times a day (QID) | ORAL | Status: DC
  Administered 2018-12-14 – 2018-12-16 (×10): 1000 mg via ORAL
  Filled 2018-12-14 (×10): qty 2

## 2018-12-14 MED ORDER — TRAMADOL HCL 50 MG PO TABS
100.0000 mg | ORAL_TABLET | Freq: Four times a day (QID) | ORAL | Status: DC
  Administered 2018-12-14 – 2018-12-16 (×5): 100 mg via ORAL
  Filled 2018-12-14 (×5): qty 2
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
## 2018-12-15 ENCOUNTER — Encounter (HOSPITAL_COMMUNITY): Payer: Self-pay | Admitting: Physical Medicine and Rehabilitation

## 2018-12-15 MED ORDER — BISACODYL 10 MG RE SUPP
10.0000 mg | Freq: Once | RECTAL | Status: AC
  Administered 2018-12-16: 10 mg via RECTAL
  Filled 2018-12-15: qty 1

## 2018-12-15 MED ORDER — APIXABAN 5 MG PO TABS
5.0000 mg | ORAL_TABLET | Freq: Two times a day (BID) | ORAL | Status: DC
  Administered 2018-12-15 – 2018-12-16 (×2): 5 mg via ORAL
  Filled 2018-12-15 (×2): qty 1
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
## 2018-12-15 NOTE — Progress Notes (Signed)
Inpatient Rehab Admissions Coordinator:   Note pt with great progress with therapies yesterday.  Will open insurance for prior authorization for possible admission tomorrow pending approval and bed availability.   Shann Medal, PT, DPT Admissions Coordinator [phone] 12/15/18  8:11 AM
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
## 2018-12-16 ENCOUNTER — Other Ambulatory Visit: Payer: Self-pay

## 2018-12-16 ENCOUNTER — Inpatient Hospital Stay (HOSPITAL_COMMUNITY)
Admission: RE | Admit: 2018-12-16 | Discharge: 2018-12-27 | DRG: 560 | Disposition: A | Discharge status: Home Health | Payer: Managed Care, Other (non HMO) | Source: Intra-hospital | Attending: Physical Medicine & Rehabilitation | Admitting: Physical Medicine & Rehabilitation

## 2018-12-16 DIAGNOSIS — I10 Essential (primary) hypertension: Secondary | ICD-10-CM | POA: Diagnosis present

## 2018-12-16 DIAGNOSIS — Z9049 Acquired absence of other specified parts of digestive tract: Secondary | ICD-10-CM | POA: Diagnosis not present

## 2018-12-16 DIAGNOSIS — S2249XA Multiple fractures of ribs, unspecified side, initial encounter for closed fracture: Secondary | ICD-10-CM | POA: Diagnosis present

## 2018-12-16 DIAGNOSIS — G5601 Carpal tunnel syndrome, right upper limb: Secondary | ICD-10-CM | POA: Diagnosis present

## 2018-12-16 DIAGNOSIS — F339 Major depressive disorder, recurrent, unspecified: Secondary | ICD-10-CM

## 2018-12-16 DIAGNOSIS — S2243XS Multiple fractures of ribs, bilateral, sequela: Secondary | ICD-10-CM | POA: Diagnosis not present

## 2018-12-16 DIAGNOSIS — Z8249 Family history of ischemic heart disease and other diseases of the circulatory system: Secondary | ICD-10-CM

## 2018-12-16 DIAGNOSIS — T07XXXA Unspecified multiple injuries, initial encounter: Secondary | ICD-10-CM

## 2018-12-16 DIAGNOSIS — S2243XD Multiple fractures of ribs, bilateral, subsequent encounter for fracture with routine healing: Secondary | ICD-10-CM | POA: Diagnosis not present

## 2018-12-16 DIAGNOSIS — Z23 Encounter for immunization: Secondary | ICD-10-CM | POA: Diagnosis not present

## 2018-12-16 DIAGNOSIS — D62 Acute posthemorrhagic anemia: Secondary | ICD-10-CM | POA: Diagnosis present

## 2018-12-16 DIAGNOSIS — Z9884 Bariatric surgery status: Secondary | ICD-10-CM | POA: Diagnosis not present

## 2018-12-16 DIAGNOSIS — Z91040 Latex allergy status: Secondary | ICD-10-CM | POA: Diagnosis not present

## 2018-12-16 DIAGNOSIS — S22058D Other fracture of T5-T6 vertebra, subsequent encounter for fracture with routine healing: Secondary | ICD-10-CM

## 2018-12-16 DIAGNOSIS — F419 Anxiety disorder, unspecified: Secondary | ICD-10-CM | POA: Diagnosis present

## 2018-12-16 DIAGNOSIS — G8918 Other acute postprocedural pain: Secondary | ICD-10-CM

## 2018-12-16 DIAGNOSIS — S22050A Wedge compression fracture of T5-T6 vertebra, initial encounter for closed fracture: Secondary | ICD-10-CM

## 2018-12-16 DIAGNOSIS — Z8619 Personal history of other infectious and parasitic diseases: Secondary | ICD-10-CM

## 2018-12-16 DIAGNOSIS — S22050S Wedge compression fracture of T5-T6 vertebra, sequela: Secondary | ICD-10-CM | POA: Diagnosis not present

## 2018-12-16 DIAGNOSIS — S42202S Unspecified fracture of upper end of left humerus, sequela: Secondary | ICD-10-CM | POA: Diagnosis not present

## 2018-12-16 DIAGNOSIS — Z881 Allergy status to other antibiotic agents status: Secondary | ICD-10-CM | POA: Diagnosis not present

## 2018-12-16 DIAGNOSIS — Z9119 Patient's noncompliance with other medical treatment and regimen: Secondary | ICD-10-CM

## 2018-12-16 DIAGNOSIS — I480 Paroxysmal atrial fibrillation: Secondary | ICD-10-CM

## 2018-12-16 DIAGNOSIS — Z79890 Hormone replacement therapy (postmenopausal): Secondary | ICD-10-CM | POA: Diagnosis not present

## 2018-12-16 DIAGNOSIS — S27329D Contusion of lung, unspecified, subsequent encounter: Secondary | ICD-10-CM | POA: Diagnosis not present

## 2018-12-16 DIAGNOSIS — K5900 Constipation, unspecified: Secondary | ICD-10-CM | POA: Diagnosis present

## 2018-12-16 DIAGNOSIS — Z7901 Long term (current) use of anticoagulants: Secondary | ICD-10-CM | POA: Diagnosis not present

## 2018-12-16 DIAGNOSIS — Z91030 Bee allergy status: Secondary | ICD-10-CM | POA: Diagnosis not present

## 2018-12-16 DIAGNOSIS — G4733 Obstructive sleep apnea (adult) (pediatric): Secondary | ICD-10-CM | POA: Diagnosis present

## 2018-12-16 DIAGNOSIS — S8012XA Contusion of left lower leg, initial encounter: Secondary | ICD-10-CM

## 2018-12-16 DIAGNOSIS — R21 Rash and other nonspecific skin eruption: Secondary | ICD-10-CM | POA: Diagnosis not present

## 2018-12-16 DIAGNOSIS — Z820 Family history of epilepsy and other diseases of the nervous system: Secondary | ICD-10-CM

## 2018-12-16 DIAGNOSIS — S42292D Other displaced fracture of upper end of left humerus, subsequent encounter for fracture with routine healing: Principal | ICD-10-CM

## 2018-12-16 DIAGNOSIS — G2581 Restless legs syndrome: Secondary | ICD-10-CM | POA: Diagnosis present

## 2018-12-16 DIAGNOSIS — G4700 Insomnia, unspecified: Secondary | ICD-10-CM | POA: Diagnosis present

## 2018-12-16 DIAGNOSIS — Z6841 Body Mass Index (BMI) 40.0 and over, adult: Secondary | ICD-10-CM | POA: Diagnosis not present

## 2018-12-16 DIAGNOSIS — L299 Pruritus, unspecified: Secondary | ICD-10-CM | POA: Diagnosis not present

## 2018-12-16 DIAGNOSIS — S42202D Unspecified fracture of upper end of left humerus, subsequent encounter for fracture with routine healing: Secondary | ICD-10-CM | POA: Diagnosis present

## 2018-12-16 DIAGNOSIS — J45909 Unspecified asthma, uncomplicated: Secondary | ICD-10-CM | POA: Diagnosis present

## 2018-12-16 DIAGNOSIS — S8012XD Contusion of left lower leg, subsequent encounter: Secondary | ICD-10-CM | POA: Diagnosis not present

## 2018-12-16 DIAGNOSIS — Z09 Encounter for follow-up examination after completed treatment for conditions other than malignant neoplasm: Secondary | ICD-10-CM

## 2018-12-16 DIAGNOSIS — Z880 Allergy status to penicillin: Secondary | ICD-10-CM | POA: Diagnosis not present

## 2018-12-16 DIAGNOSIS — T1490XA Injury, unspecified, initial encounter: Secondary | ICD-10-CM | POA: Diagnosis present

## 2018-12-16 DIAGNOSIS — K5901 Slow transit constipation: Secondary | ICD-10-CM | POA: Diagnosis not present

## 2018-12-16 DIAGNOSIS — Z79899 Other long term (current) drug therapy: Secondary | ICD-10-CM

## 2018-12-16 DIAGNOSIS — S42292S Other displaced fracture of upper end of left humerus, sequela: Secondary | ICD-10-CM

## 2018-12-16 DIAGNOSIS — F329 Major depressive disorder, single episode, unspecified: Secondary | ICD-10-CM | POA: Diagnosis present

## 2018-12-16 HISTORY — DX: Other specified postprocedural states: Z98.890

## 2018-12-16 HISTORY — DX: Other specified postprocedural states: R11.2

## 2018-12-16 HISTORY — DX: Other complications of anesthesia, initial encounter: T88.59XA

## 2018-12-16 LAB — CBC
HCT: 30.6 % — ABNORMAL LOW (ref 36.0–46.0)
Hemoglobin: 10.3 g/dL — ABNORMAL LOW (ref 12.0–15.0)
MCH: 34.6 pg — ABNORMAL HIGH (ref 26.0–34.0)
MCHC: 33.7 g/dL (ref 30.0–36.0)
MCV: 102.7 fL — ABNORMAL HIGH (ref 80.0–100.0)
Platelets: 356 10*3/uL (ref 150–400)
RBC: 2.98 MIL/uL — ABNORMAL LOW (ref 3.87–5.11)
RDW: 13.9 % (ref 11.5–15.5)
WBC: 7.3 10*3/uL (ref 4.0–10.5)
nRBC: 0 % (ref 0.0–0.2)

## 2018-12-16 LAB — VITAMIN B12: Vitamin B-12: 150 pg/mL — ABNORMAL LOW (ref 180–914)

## 2018-12-16 MED ORDER — SENNOSIDES-DOCUSATE SODIUM 8.6-50 MG PO TABS
2.0000 | ORAL_TABLET | Freq: Every day | ORAL | Status: DC
  Administered 2018-12-16 – 2018-12-22 (×6): 2 via ORAL
  Filled 2018-12-16 (×10): qty 2

## 2018-12-16 MED ORDER — ESTRADIOL 0.1 MG/24HR TD PTWK: 0.1000 mg | MEDICATED_PATCH | TRANSDERMAL | Status: DC

## 2018-12-16 MED ORDER — ALPRAZOLAM 0.5 MG PO TABS: 0.5000 mg | ORAL_TABLET | Freq: Every evening | ORAL | Status: DC | PRN

## 2018-12-16 MED ORDER — BISACODYL 10 MG RE SUPP: 10.0000 mg | Freq: Every day | RECTAL | Status: DC | PRN

## 2018-12-16 MED ORDER — ALUM & MAG HYDROXIDE-SIMETH 200-200-20 MG/5ML PO SUSP: 30.0000 mL | ORAL | Status: DC | PRN

## 2018-12-16 MED ORDER — ESTRADIOL 0.1 MG/24HR TD PTTW: 0.1000 mg | MEDICATED_PATCH | TRANSDERMAL | Status: DC

## 2018-12-16 MED ORDER — APIXABAN 5 MG PO TABS
5.0000 mg | ORAL_TABLET | Freq: Two times a day (BID) | ORAL | Status: DC
  Administered 2018-12-16 – 2018-12-24 (×16): 5 mg via ORAL
  Filled 2018-12-16 (×16): qty 1

## 2018-12-16 MED ORDER — FLUOXETINE HCL 20 MG PO CAPS
40.0000 mg | ORAL_CAPSULE | Freq: Every day | ORAL | Status: DC
  Administered 2018-12-17 – 2018-12-27 (×11): 40 mg via ORAL
  Filled 2018-12-16 (×13): qty 2

## 2018-12-16 MED ORDER — PROCHLORPERAZINE MALEATE 5 MG PO TABS: 5.0000 mg | ORAL_TABLET | Freq: Four times a day (QID) | ORAL | Status: DC | PRN

## 2018-12-16 MED ORDER — ACETAMINOPHEN 325 MG PO TABS
650.0000 mg | ORAL_TABLET | Freq: Four times a day (QID) | ORAL | Status: DC
  Administered 2018-12-17 – 2018-12-27 (×41): 650 mg via ORAL
  Filled 2018-12-16 (×42): qty 2

## 2018-12-16 MED ORDER — TRAZODONE HCL 50 MG PO TABS
25.0000 mg | ORAL_TABLET | Freq: Every evening | ORAL | Status: DC | PRN
  Administered 2018-12-21 – 2018-12-22 (×2): 50 mg via ORAL
  Filled 2018-12-16 (×2): qty 1

## 2018-12-16 MED ORDER — FLEET ENEMA 7-19 GM/118ML RE ENEM: 1.0000 | ENEMA | Freq: Once | RECTAL | Status: DC | PRN

## 2018-12-16 MED ORDER — METOPROLOL SUCCINATE ER 25 MG PO TB24
25.0000 mg | ORAL_TABLET | Freq: Two times a day (BID) | ORAL | Status: DC
  Administered 2018-12-16 – 2018-12-27 (×22): 25 mg via ORAL
  Filled 2018-12-16 (×22): qty 1

## 2018-12-16 MED ORDER — ACETAMINOPHEN 325 MG PO TABS
325.0000 mg | ORAL_TABLET | ORAL | Status: DC | PRN
  Administered 2018-12-16: 650 mg via ORAL

## 2018-12-16 MED ORDER — GUAIFENESIN-DM 100-10 MG/5ML PO SYRP: 5.0000 mL | ORAL_SOLUTION | Freq: Four times a day (QID) | ORAL | Status: DC | PRN

## 2018-12-16 MED ORDER — LEVOTHYROXINE SODIUM 50 MCG PO TABS
50.0000 ug | ORAL_TABLET | Freq: Every day | ORAL | Status: DC
  Administered 2018-12-17 – 2018-12-27 (×11): 50 ug via ORAL
  Filled 2018-12-16 (×11): qty 1

## 2018-12-16 MED ORDER — ALBUTEROL SULFATE (2.5 MG/3ML) 0.083% IN NEBU: 2.5000 mg | INHALATION_SOLUTION | Freq: Four times a day (QID) | RESPIRATORY_TRACT | Status: DC | PRN

## 2018-12-16 MED ORDER — PANTOPRAZOLE SODIUM 40 MG PO TBEC
40.0000 mg | DELAYED_RELEASE_TABLET | Freq: Every day | ORAL | Status: DC
  Administered 2018-12-17 – 2018-12-27 (×11): 40 mg via ORAL
  Filled 2018-12-16 (×11): qty 1

## 2018-12-16 MED ORDER — CYCLOBENZAPRINE HCL 5 MG PO TABS: 5.0000 mg | ORAL_TABLET | Freq: Three times a day (TID) | ORAL | Status: DC | PRN

## 2018-12-16 MED ORDER — VITAMIN B-12 1000 MCG PO TABS
1000.0000 ug | ORAL_TABLET | Freq: Every day | ORAL | Status: DC
  Administered 2018-12-17 – 2018-12-27 (×11): 1000 ug via ORAL
  Filled 2018-12-16 (×11): qty 1

## 2018-12-16 MED ORDER — PROCHLORPERAZINE 25 MG RE SUPP: 12.5000 mg | Freq: Four times a day (QID) | RECTAL | Status: DC | PRN

## 2018-12-16 MED ORDER — POLYETHYLENE GLYCOL 3350 17 G PO PACK
17.0000 g | PACK | Freq: Two times a day (BID) | ORAL | Status: DC
  Administered 2018-12-16 – 2018-12-27 (×15): 17 g via ORAL
  Filled 2018-12-16 (×22): qty 1

## 2018-12-16 MED ORDER — DIPHENHYDRAMINE HCL 12.5 MG/5ML PO ELIX: 12.5000 mg | ORAL_SOLUTION | Freq: Four times a day (QID) | ORAL | Status: DC | PRN

## 2018-12-16 MED ORDER — GABAPENTIN 400 MG PO CAPS
400.0000 mg | ORAL_CAPSULE | Freq: Three times a day (TID) | ORAL | Status: DC
  Administered 2018-12-16 – 2018-12-26 (×28): 400 mg via ORAL
  Filled 2018-12-16 (×28): qty 1

## 2018-12-16 MED ORDER — TRAMADOL HCL 50 MG PO TABS
100.0000 mg | ORAL_TABLET | Freq: Four times a day (QID) | ORAL | Status: DC
  Administered 2018-12-16 – 2018-12-27 (×39): 100 mg via ORAL
  Filled 2018-12-16 (×43): qty 2

## 2018-12-16 MED ORDER — TRAZODONE HCL 50 MG PO TABS: 50.0000 mg | ORAL_TABLET | Freq: Every evening | ORAL | Status: DC | PRN

## 2018-12-16 MED ORDER — PROCHLORPERAZINE EDISYLATE 10 MG/2ML IJ SOLN: 5.0000 mg | Freq: Four times a day (QID) | INTRAMUSCULAR | Status: DC | PRN

## 2018-12-16 MED ORDER — MAGNESIUM HYDROXIDE 400 MG/5ML PO SUSP: 30.0000 mL | Freq: Every day | ORAL | Status: DC | PRN

## 2018-12-16 MED ORDER — METHOCARBAMOL 500 MG PO TABS
1000.0000 mg | ORAL_TABLET | Freq: Four times a day (QID) | ORAL | Status: DC
  Administered 2018-12-16 – 2018-12-27 (×41): 1000 mg via ORAL
  Filled 2018-12-16 (×41): qty 2

## 2018-12-16 MED ORDER — OXYCODONE HCL 5 MG PO TABS
5.0000 mg | ORAL_TABLET | ORAL | Status: DC | PRN
  Administered 2018-12-17 – 2018-12-19 (×5): 10 mg via ORAL
  Administered 2018-12-20: 5 mg via ORAL
  Administered 2018-12-20 – 2018-12-27 (×11): 10 mg via ORAL
  Filled 2018-12-16: qty 2
  Filled 2018-12-16: qty 1
  Filled 2018-12-16 (×16): qty 2

## 2018-12-16 MED ORDER — VITAMIN D 25 MCG (1000 UNIT) PO TABS
1000.0000 [IU] | ORAL_TABLET | Freq: Every day | ORAL | Status: DC
  Administered 2018-12-17 – 2018-12-27 (×11): 1000 [IU] via ORAL
  Filled 2018-12-16 (×12): qty 1

## 2018-12-16 MED ORDER — ADULT MULTIVITAMIN W/MINERALS CH
1.0000 | ORAL_TABLET | Freq: Every day | ORAL | Status: DC
  Administered 2018-12-17 – 2018-12-27 (×11): 1 via ORAL
  Filled 2018-12-16 (×11): qty 1

## 2018-12-16 NOTE — Progress Notes (Signed)
Patient would like to visit with her daughter outside. Saverio Danker NP was notified. Verbal order received. Chaplain Brion Aliment agreed to accompany the patient.

## 2018-12-16 NOTE — Discharge Instructions (Signed)
Sling to left upper extremity.  Nonweightbaring  Rib Fracture  A rib fracture is a break or crack in one of the bones of the ribs. The ribs are like a cage that goes around your upper chest. A broken or cracked rib is often painful, but most do not cause other problems. Most rib fractures usually heal on their own in 1-3 months. Follow these instructions at home: Managing pain, stiffness, and swelling  If directed, apply ice to the injured area. ? Put ice in a plastic bag. ? Place a towel between your skin and the bag. ? Leave the ice on for 20 minutes, 2-3 times a day.  Take over-the-counter and prescription medicines only as told by your doctor. Activity  Avoid activities that cause pain to the injured area. Protect your injured area.  Slowly increase activity as told by your doctor. General instructions  Do deep breathing as told by your doctor. You may be told to: ? Take deep breaths many times a day. ? Cough many times a day while hugging a pillow. ? Use a device (incentive spirometer) to do deep breathing many times a day.  Drink enough fluid to keep your pee (urine) clear or pale yellow.  Do not wear a rib belt or binder. These do not allow you to breathe deeply.  Keep all follow-up visits as told by your doctor. This is important. Contact a doctor if:  You have a fever. Get help right away if:  You have trouble breathing.  You are short of breath.  You cannot stop coughing.  You cough up thick or bloody spit (sputum).  You feel sick to your stomach (nauseous), throw up (vomit), or have belly (abdominal) pain.  Your pain gets worse and medicine does not help. Summary  A rib fracture is a break or crack in one of the bones of the ribs.  Apply ice to the injured area and take medicines for pain as told by your doctor.  Take deep breaths and cough many times a day. Hug a pillow every time you cough. This information is not intended to replace advice given to  you by your health care provider. Make sure you discuss any questions you have with your health care provider. Document Released: 12/17/2007 Document Revised: 02/19/2017 Document Reviewed: 06/09/2016 Elsevier Patient Education  2020 [place].   Thoracic Spine Fracture A thoracic spine fracture is a break in one of the bones of the middle part of the back. The fracture can be mild or very bad. The most serious types cause the broken bones to:  Move out of place (unstable).  Damage or press on the main nerve in the spine (spinal cord). In some cases, the bone that connects to the lower part of the back may also have a break (thoracolumbar fracture). What are the causes? This condition may be caused by:  A car accident.  A fall.  A sports accident.  Violent acts. These include assaults or gunshots. What are the signs or symptoms? Symptoms may include:  Back pain.  Trouble standing or walking.  Numbness.  Tingling.  Weakness.  Loss of movement.  Being unable to control when to pee or poop (incontinence). How is this treated? Treatment may include:  Medicines.  A cast or a brace.  Physical therapy.  Surgery. This may be needed for very bad fractures. Follow these instructions at home: Medicines  Take medicines only as told by your doctor.  Do not drive or use  heavy machinery while taking pain medicine.  To prevent or treat trouble pooping (constipation) while you are taking prescription pain medicine, your doctor may recommend that you: ? Drink enough fluid to keep your pee (urine) pale yellow. ? Take over-the-counter or prescription medicines. ? Eat foods that are high in fiber. This includes fresh fruits and vegetables, whole grains, and beans. ? Limit foods that are high in fat and processed sugars. This includes fried or sweet foods. If you have a brace:  Wear the back brace as told by your doctor. Remove it only as told by your doctor.  Keep the  brace clean.  If the brace is not waterproof: ? Do not let it get wet. ? Cover it with a watertight covering when you take a bath or a shower. Activity  Stay in bed (on bed rest) only as told by your doctor.  Ask your doctor what is safe for you to do.  Return to your normal activities as told by your doctor.  Do back exercises (physical therapy) as told by your doctor.  Exercise often as told by your doctor. Managing pain, stiffness, and swelling   If told, put ice on the injured area: ? Put ice in a plastic bag. ? Place a towel between your skin and the bag. ? Leave the ice on for 20 minutes, 2-3 times a day. General instructions  Do not use any products that contain nicotine or tobacco, such as cigarettes and e-cigarettes. If you need help quitting, ask your doctor.  Do not drink alcohol.  Keep all follow-up visits as told by your doctor. This is important. Contact a doctor if:  You have a fever.  You have a cough that makes your pain worse.  Your pain medicine is not helping.  Your pain does not get better over time.  You cannot return to your normal activities as planned. Get help right away if:  Your pain is bad and it suddenly gets worse.  You are not able to move any part of your body (paralysis) that is below the level of your injury.  You have numbness, tingling, or weakness in any part of your body that is below the level of your injury.  You cannot control when you pee (urinate) or when you poop (pass stool). Summary  A thoracic spine fracture is a break in one of the bones of the middle part of the back.  A stable fracture can be treated with a back brace, activity restrictions, pain medicine, and physical therapy. A more severe fracture may require surgery.  Make sure you know what symptoms should cause you to get help right away. This information is not intended to replace advice given to you by your health care provider. Make sure you discuss  any questions you have with your health care provider. Document Released: 08/27/2009 Document Revised: 04/23/2017 Document Reviewed: 04/23/2017 Elsevier Patient Education  2020 [employer].
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
## 2018-12-16 NOTE — Progress Notes (Signed)
Pt admitted to room 4W01. Pt oriented to floor, room, call bell and rehab fall policy. Complaint of pain rated 8/10. Pain med given. No other complaint at this time. Continue plan of care. Gerald Stabs, RN
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
## 2018-12-16 NOTE — Progress Notes (Signed)
Inpatient Rehab Admissions Coordinator:   I have insurance authorization and a bed available for pt to admit to CIR today. I will let pt/family, RN, and CM know.   Shann Medal, PT, DPT Admissions Coordinator [phone] 12/16/18  11:14 AM

## 2018-12-16 NOTE — Progress Notes (Signed)
Chaplain visited with patient to assist with providing an alternative to visiting with family.  The patient's mood was greatly improved by this visit.  Chaplain also was able to discuss with the patient some of the difficulties surrounding the care of the patient's mother.  The chaplain will follow-up with the patient at a later date.  Brion Aliment Chaplain Resident For questions concerning this note please contact me by pager [phone]
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
## 2018-12-17 ENCOUNTER — Inpatient Hospital Stay (HOSPITAL_COMMUNITY): Payer: Managed Care, Other (non HMO) | Admitting: Physical Therapy

## 2018-12-17 ENCOUNTER — Inpatient Hospital Stay (HOSPITAL_COMMUNITY): Payer: Managed Care, Other (non HMO)

## 2018-12-17 DIAGNOSIS — S22050S Wedge compression fracture of T5-T6 vertebra, sequela: Secondary | ICD-10-CM

## 2018-12-17 DIAGNOSIS — K5901 Slow transit constipation: Secondary | ICD-10-CM

## 2018-12-17 DIAGNOSIS — D62 Acute posthemorrhagic anemia: Secondary | ICD-10-CM

## 2018-12-17 DIAGNOSIS — S42202S Unspecified fracture of upper end of left humerus, sequela: Secondary | ICD-10-CM

## 2018-12-17 LAB — CBC WITH DIFFERENTIAL/PLATELET
Abs Immature Granulocytes: 0.05 10*3/uL (ref 0.00–0.07)
Basophils Absolute: 0 10*3/uL (ref 0.0–0.1)
Basophils Relative: 0 %
Eosinophils Absolute: 0.6 10*3/uL — ABNORMAL HIGH (ref 0.0–0.5)
Eosinophils Relative: 9 %
HCT: 31.4 % — ABNORMAL LOW (ref 36.0–46.0)
Hemoglobin: 10.1 g/dL — ABNORMAL LOW (ref 12.0–15.0)
Immature Granulocytes: 1 %
Lymphocytes Relative: 25 %
Lymphs Abs: 1.8 10*3/uL (ref 0.7–4.0)
MCH: 33.8 pg (ref 26.0–34.0)
MCHC: 32.2 g/dL (ref 30.0–36.0)
MCV: 105 fL — ABNORMAL HIGH (ref 80.0–100.0)
Monocytes Absolute: 0.8 10*3/uL (ref 0.1–1.0)
Monocytes Relative: 11 %
Neutro Abs: 3.8 10*3/uL (ref 1.7–7.7)
Neutrophils Relative %: 54 %
Platelets: 408 10*3/uL — ABNORMAL HIGH (ref 150–400)
RBC: 2.99 MIL/uL — ABNORMAL LOW (ref 3.87–5.11)
RDW: 14.2 % (ref 11.5–15.5)
WBC: 7.1 10*3/uL (ref 4.0–10.5)
nRBC: 0 % (ref 0.0–0.2)

## 2018-12-17 LAB — GLUCOSE, CAPILLARY: Glucose-Capillary: 94 mg/dL (ref 70–99)

## 2018-12-17 LAB — COMPREHENSIVE METABOLIC PANEL
ALT: 16 U/L (ref 0–44)
AST: 17 U/L (ref 15–41)
Albumin: 2.8 g/dL — ABNORMAL LOW (ref 3.5–5.0)
Alkaline Phosphatase: 148 U/L — ABNORMAL HIGH (ref 38–126)
Anion gap: 7 (ref 5–15)
BUN: 9 mg/dL (ref 6–20)
CO2: 29 mmol/L (ref 22–32)
Calcium: 8.3 mg/dL — ABNORMAL LOW (ref 8.9–10.3)
Chloride: 100 mmol/L (ref 98–111)
Creatinine, Ser: 0.87 mg/dL (ref 0.44–1.00)
GFR calc Af Amer: >60 mL/min (ref >60)
GFR calc non Af Amer: >60 mL/min (ref >60)
Glucose, Bld: 95 mg/dL (ref 70–99)
Potassium: 4.1 mmol/L (ref 3.5–5.1)
Sodium: 136 mmol/L (ref 135–145)
Total Bilirubin: 0.9 mg/dL (ref 0.3–1.2)
Total Protein: 5.7 g/dL — ABNORMAL LOW (ref 6.5–8.1)

## 2018-12-17 MED ORDER — INFLUENZA VAC A&B SA ADJ QUAD 0.5 ML IM PRSY
0.5000 mL | PREFILLED_SYRINGE | INTRAMUSCULAR | Status: AC
  Administered 2018-12-18: 0.5 mL via INTRAMUSCULAR
  Filled 2018-12-17: qty 0.5

## 2018-12-17 MED ORDER — SORBITOL 70 % SOLN
60.0000 mL | Status: AC
  Administered 2018-12-17: 60 mL via ORAL
  Filled 2018-12-17: qty 60
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
## 2018-12-17 NOTE — Progress Notes (Signed)
Occupational Therapy Session Note  Patient Details  Name: PADDY WALTHALL MRN: [id number] Date of Birth: 27-Aug-1965  Today's Date: 12/17/2018 OT Individual Time: 1500-1605 OT Individual Time Calculation (min): 65 min    Short Term Goals: Week 1:  OT Short Term Goal 1 (Week 1): Pt will transfer to toilet with CGA and LRAD OT Short Term Goal 2 (Week 1): Pt will don shirt with MIN A OT Short Term Goal 3 (Week 1): Pt will roll with MOD A to don LSO to decrease burden of care during TLSO application OT Short Term Goal 4 (Week 1): Pt will don B socks with S  Skilled Therapeutic Interventions/Progress Updates:    1:1. Pt received on toilet with 5/10 pain in shoulders, Ice provided. Pt completes sit to stand from toilet with MIN A and OT cleans buttocks after BM. Edu re toilet wand, tongs v fresh brush for AE for self cleansing. Pt ambulates to sink to wash hands with MIN A and quad cane. Pt sits in w/c and OT rinses/washes hair. Pt able to comb hair with setup. Pt and OT discuss use of gowns at home to decrease demand of clothing limiting independence for toileting. Pt verbalizes understanding and will have family bring in to practice. Pt returns to bed with MAX A to sit>supine and roll to remove brace. Exited session with pt seated in bed, exit alarm on and call light in reach  Therapy Documentation Precautions:  Precautions Precautions: Shoulder, Back, Fall Shoulder Interventions: Shoulder sling/immobilizer(L UE) Precaution Booklet Issued: No Precaution Comments: brace if HOB is >30 degrees, sling to L UE  Required Braces or Orthoses: Spinal Brace, Other Brace, Sling Spinal Brace: Thoracolumbosacral orthotic, Applied in supine position Other Brace: CAM walker R LE Restrictions Weight Bearing Restrictions: Yes LUE Weight Bearing: Non weight bearing RLE Weight Bearing: Weight bearing as tolerated Other Position/Activity Restrictions: RLE WBAT in CAM boot General:   Vital Signs:    Pain:   ADL:   Vision   Perception    Praxis   Exercises:   Other Treatments:     Therapy/Group: Individual Therapy  Tonny Branch 12/17/2018, 4:25 PM

## 2018-12-17 NOTE — Progress Notes (Signed)
Patient noted in bed during shift. At the time of her scheduled medication at around midnight, patient had turned over some water in her bed & a whole bed change was performed. She c/o pain to her right shoulder & back. It hurt her to turn. Max assist was given with 2 persons, Her skin has multiple bruises & abrasions, especially the left side of her back. She was very tearful & cried in pain as she was turned. She had tylenol & tramadol scheduled to be given. She declined the tramadol & wanted the prn Oxycodone instead. She was given the Oxycodone with the tylenol. She has some +2 pitting edema to BLE. She was asked if she asked for it before because she commented that she had not had the Oxycodone all day. She stated that she thought that the tramadol would cover her pain, so she tried not to ask for it, but it hurts so much at this time. Her K pad had arrived & was set up for her. She stated that she wanted it to the back of her right shoulder. It was applied. Will continue to monitor for changes. No acute distress noted
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
## 2018-12-18 ENCOUNTER — Inpatient Hospital Stay (HOSPITAL_COMMUNITY): Payer: Managed Care, Other (non HMO)

## 2018-12-18 LAB — GLUCOSE, CAPILLARY: Glucose-Capillary: 89 mg/dL (ref 70–99)
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
## 2018-12-18 NOTE — Progress Notes (Signed)
Occupational Therapy Session Note  Patient Details  Name: [name] MRN: [id number] Date of Birth: August 26, 1965  Today's Date: 12/18/2018 OT Individual Time: 1430-1610 OT Individual Time Calculation (min): 100 min    Short Term Goals: Week 1:  OT Short Term Goal 1 (Week 1): Pt will transfer to toilet with CGA and LRAD OT Short Term Goal 2 (Week 1): Pt will don shirt with MIN A OT Short Term Goal 3 (Week 1): Pt will roll with MOD A to don LSO to decrease burden of care during TLSO application OT Short Term Goal 4 (Week 1): Pt will don B socks with S  Skilled Therapeutic Interventions/Progress Updates:    1:1. Pt received in bed agreeable to tx. OT retrieves modular hose cell phone holder to improve access to phone d/t 1UE use and pt is thrilled. Pt requesting to bathe and dress as pt has clothing from home. Pt bathes UB with A to hold up LUE to get into armpit and wash RUE. Pt able to wash peri area and BLE by crossing into figure 4. Pt requires MAX A to don shirt with VC for hemi dressing technique. Pt completes rolling in B directions with MOD A while OT places TLSO on un supine and pulls shirt down back. Pt able to don L sock and thread BLE into underwear and pant with VC for threading RLE first. Pt supine>sitting EOB with MOD A and VC for visual fixation. Pt sit to stand at EOB with quad cane. Pt washes buttocks with toilet tongs! But requires A to advance pants past hips in standing.OT installs elastic shoe laces in shoes and provides A to don shoes. Pt ambulates 31' with quad cane with VC for visual fixation during turning. Exited session with pt seated in recliner, call light in reach and all needs met  Therapy Documentation Precautions:  Precautions Precautions: Shoulder, Back, Fall Shoulder Interventions: Shoulder sling/immobilizer(L UE) Precaution Booklet Issued: No Precaution Comments: brace if HOB is >30 degrees, sling to L UE  Required Braces or Orthoses: Spinal Brace, Other  Brace, Sling Spinal Brace: Thoracolumbosacral orthotic, Applied in supine position Other Brace: CAM walker R LE Restrictions Weight Bearing Restrictions: Yes LUE Weight Bearing: Non weight bearing RLE Weight Bearing: Weight bearing as tolerated Other Position/Activity Restrictions: RLE WBAT in CAM boot General:   Vital Signs: Therapy Vitals Temp: 98.4 F (36.9 C) Temp Source: Oral Pulse Rate: 86 Resp: 18 BP: 128/80 Patient Position (if appropriate): Lying Oxygen Therapy SpO2: 95 % O2 Device: Room Air Pain: Pain Assessment Pain Scale: 0-10 Pain Score: 5  Pain Type: Acute pain Pain Location: Shoulder Pain Orientation: Right;Left Pain Descriptors / Indicators: Aching Pain Onset: On-going Pain Intervention(s): Medication (See eMAR) ADL:   Vision   Perception    Praxis   Exercises:   Other Treatments:     Therapy/Group: Individual Therapy  Tonny Branch 12/18/2018, 4:32 PM

## 2018-12-19 ENCOUNTER — Inpatient Hospital Stay (HOSPITAL_COMMUNITY): Payer: Managed Care, Other (non HMO) | Admitting: Physical Therapy

## 2018-12-19 ENCOUNTER — Inpatient Hospital Stay (HOSPITAL_COMMUNITY): Payer: Managed Care, Other (non HMO)

## 2018-12-19 DIAGNOSIS — T1490XA Injury, unspecified, initial encounter: Secondary | ICD-10-CM

## 2018-12-19 LAB — BASIC METABOLIC PANEL
Anion gap: 9 (ref 5–15)
BUN: 9 mg/dL (ref 6–20)
CO2: 27 mmol/L (ref 22–32)
Calcium: 8.5 mg/dL — ABNORMAL LOW (ref 8.9–10.3)
Chloride: 103 mmol/L (ref 98–111)
Creatinine, Ser: 0.93 mg/dL (ref 0.44–1.00)
GFR calc Af Amer: >60 mL/min (ref >60)
GFR calc non Af Amer: >60 mL/min (ref >60)
Glucose, Bld: 94 mg/dL (ref 70–99)
Potassium: 4.1 mmol/L (ref 3.5–5.1)
Sodium: 139 mmol/L (ref 135–145)

## 2018-12-19 MED ORDER — HYDROCORTISONE 1 % EX CREA
TOPICAL_CREAM | Freq: Three times a day (TID) | CUTANEOUS | Status: DC
  Administered 2018-12-19 – 2018-12-21 (×4): via TOPICAL
  Administered 2018-12-21: 1 via TOPICAL
  Administered 2018-12-21 – 2018-12-27 (×16): via TOPICAL
  Filled 2018-12-19 (×2): qty 28

## 2018-12-19 NOTE — Progress Notes (Signed)
Inpatient Rehabilitation  Patient information reviewed and entered into eRehab system by Kiasia Chou M. Mckensey Berghuis, M.A., CCC/SLP, PPS Coordinator.  Information including medical coding, functional ability and quality indicators will be reviewed and updated through discharge.    

## 2018-12-19 NOTE — Discharge Instructions (Addendum)
Inpatient Rehab Discharge Instructions  [name] Discharge date and time:  12/27/18  Activities/Precautions/ Functional Status:  Activity: no lifting, driving, or strenuous exercise  till cleared by MD Diet: regular diet Wound Care: keep wound clean and dry.  Contact Dr. Everardo Pacific if you develop any problems with your incision/wound--redness, swelling, increase in pain, drainage or if you develop fever or chills.   ___ No restrictions     ___ Walk up steps independently ___ 24/7 supervision/assistance   ___ Walk up steps with assistance _X__ Intermittent supervision/assistance  ___ Bathe/dress independently ___ Walk with walker     ___ Bathe/dress with assistance ___ Walk Independently    ___ Shower independently ___ Walk with assistance    _X__ Shower with assistance _X__ No alcohol     ___ Return to work/school ________   COMMUNITY REFERRALS UPON DISCHARGE:    Home Health:   PT     OT                      Agency:  [facility]         Phone: [phone]   Medical Equipment/Items Ordered:  Hospital bed, drop arm and SBQC                                                       Agency/Supplier:  [phone]    Special Instructions: 1. Back Brace has to be put on in bed 2. No weight on left arm. No range of motion at shoulder.  Keep arm in sling at all times.  3. Gentle range of motion right ankle (figure 8/ABC)  My questions have been answered and I understand these instructions. I will adhere to these goals and the provided educational materials after my discharge from the hospital.  Patient/Caregiver Signature _______________________________ Date __________  Clinician Signature _______________________________________ Date __________  Please bring this form and your medication list with you to all your follow-up doctor's appointments. Information on my medicine - ELIQUIS (apixaban)  This medication education was reviewed with me or my healthcare representative as  part of my discharge preparation.  Why was Eliquis prescribed for you? Eliquis was prescribed for you to reduce the risk of a blood clot forming that can cause a stroke if you have a medical condition called atrial fibrillation (a type of irregular heartbeat).  What do You need to know about Eliquis ? Take your Eliquis TWICE DAILY - one tablet in the morning and one tablet in the evening with or without food. If you have difficulty swallowing the tablet whole please discuss with your pharmacist how to take the medication safely.  Take Eliquis exactly as prescribed by your doctor and DO NOT stop taking Eliquis without talking to the doctor who prescribed the medication.  Stopping may increase your risk of developing a stroke.  Refill your prescription before you run out.  After discharge, you should have regular check-up appointments with your healthcare provider that is prescribing your Eliquis.  In the future your dose may need to be changed if your kidney function or weight changes by a significant amount or as you get older.  What do you do if you miss a dose? If you miss a dose, take it as soon as you remember on the same day and resume taking twice  daily.  Do not take more than one dose of ELIQUIS at the same time to make up a missed dose.  Important Safety Information A possible side effect of Eliquis is bleeding. You should call your healthcare provider right away if you experience any of the following: ? Bleeding from an injury or your nose that does not stop. ? Unusual colored urine (red or dark brown) or unusual colored stools (red or black). ? Unusual bruising for unknown reasons. ? A serious fall or if you hit your head (even if there is no bleeding).  Some medicines may interact with Eliquis and might increase your risk of bleeding or clotting while on Eliquis. To help avoid this, consult your healthcare provider or pharmacist prior to using any new prescription or  non-prescription medications, including herbals, vitamins, non-steroidal anti-inflammatory drugs (NSAIDs) and supplements.  This website has more information on Eliquis (apixaban): http://[email or web address].com/eliquis/home

## 2018-12-19 NOTE — Plan of Care (Signed)
  Problem: Consults Goal: RH BRAIN INJURY PATIENT EDUCATION Description: Description: See Patient Education module for eduction specifics Outcome: Progressing   Problem: RH BOWEL ELIMINATION Goal: RH STG MANAGE BOWEL WITH ASSISTANCE Description: STG Manage Bowel with mod I Assistance. Outcome: Progressing Goal: RH STG MANAGE BOWEL W/MEDICATION W/ASSISTANCE Description: STG Manage Bowel with Medication with Mod I Assistance. Outcome: Progressing   Problem: RH BLADDER ELIMINATION Goal: RH STG MANAGE BLADDER WITH ASSISTANCE Description: STG Manage Bladder With Mod I Assistance Outcome: Progressing   Problem: RH SKIN INTEGRITY Goal: RH STG SKIN FREE OF INFECTION/BREAKDOWN Description: Maintain skin free of infection or new breakdown with Mod I assist Outcome: Progressing   Problem: RH SAFETY Goal: RH STG ADHERE TO SAFETY PRECAUTIONS W/ASSISTANCE/DEVICE Description: STG Adhere to Safety Precautions With cues and reminders Outcome: Progressing Goal: RH STG DECREASED RISK OF FALL WITH ASSISTANCE Description: STG Decreased Risk of Fall With cues and remiders Outcome: Progressing   Problem: RH PAIN MANAGEMENT Goal: RH STG PAIN MANAGED AT OR BELOW PT'S PAIN GOAL Description: Pain level less than 4 on scale of 0-10 Outcome: Progressing   Problem: RH KNOWLEDGE DEFICIT BRAIN INJURY Goal: RH STG INCREASE KNOWLEDGE OF SELF CARE AFTER BRAIN INJURY Description: Pt will be able to identify measures to prevent falls and injuries like removing environmental hazards with mod I assist.  Outcome: Progressing   
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
## 2018-12-19 NOTE — Progress Notes (Signed)
Physical Therapy Session Note  Patient Details  Name: [name] MRN: [id number] Date of Birth: [birth year]  Today's Date: 12/19/2018 PT Individual Time: [phone] and 1114-1208 PT Individual Time Calculation (min): 57 min and 54 min  Short Term Goals: Week 1:  PT Short Term Goal 1 (Week 1): Pt will increase bed mobility to min A. PT Short Term Goal 2 (Week 1): Pt will increase transfers to S. PT Short Term Goal 3 (Week 1): Pt will increase ambulation with LRAD aout 50 feet with S. PT Short Term Goal 4 (Week 1): Pt will ascend/descend 12 stairs with 1 rail and c/g., PT Short Term Goal 5 (Week 1): Pt will propel w/c about 50 feet with S.  Skilled Therapeutic Interventions/Progress Updates:  Treatment 1: Pt received in recliner & agreeable to tx. First half of session focused on discussing d/c plans and pt's need for assistance upon d/c. Pt also very emotional regarding personal situation & health of her mother with therapist providing therapeutic listening & encouragement. Pt reports she will likely have to d/c to her dad & step mom's house that is 1 level with 3-4 STE without rails, where they can provide intermittent care for pt & her 53 y/o daughter. Pt completes sit<>stand transfers with supervision with cuing for hand placement and ambulates room>gym>ortho gym>room with CGA & quad cane with instructional cuing to keep all four points of QC on ground. Pt completes car transfer (pt unable to transfer BLE in/out of car 2/2 body habitus & size of car) with CGA. PT recommends family bring in SUV for pt to practice real car transfer prior to d/c. At end of session pt left in recliner with ice packs applied to L shoulder, call bell & all needs in reach.  Pt reports dizziness when turning with gait & requires time to complete turns. Pt reports vestibular eval was completed on acute care side with recommendations for gaze stabilization exercises 2/2 hypofunction until pt can tolerate BPPV eval (pt  requesting more time for this). Provided pt with paper with letter on her wall so she can perform exercises in the room.  Pain: pt c/o 2/10 pain at rest across B posterior shoulder blades - rest breaks & repositioning provided PRN & pt reports she's premedicated prior to session.    Treatment 2: Pt received in recliner & agreeable to tx. Pt transfers sit<>stand with supervision & ambulates room>gym>dayroom>room with QC & supervision<>CGA with instructional cuing to place all four points of QC on ground & min assist on 1 occasion 2/2 LOB when turning to L. Pt performs 5x sit<>stand with 1 armrest with supervision & task focusing on blocked practice for hand placement and BLE strengthening. Pt negotiates 4 steps (6") with QC & min assist with instructional cuing for compensatory technique. Pt with difficulty looking down 2/2 increased dizziness when doing so. In dayroom, at high/low table pt performed BLE hip/knee flexion & standing hamstring curls all 2 sets x 10 reps. At end of session pt left in recliner with ice pack applied to posterior neck/upper back, call bell & all needs in reach.   Pain: pt c/o pain in L calf & B shoulders with rest breaks provided & RN made aware.    Therapy Documentation Precautions:  Precautions Precautions: Shoulder, Back, Fall Shoulder Interventions: Shoulder sling/immobilizer(L UE) Precaution Booklet Issued: No Precaution Comments: brace if HOB is >30 degrees, sling to L UE  Required Braces or Orthoses: Spinal Brace, Other Brace, Sling Spinal Brace: Thoracolumbosacral orthotic,  Applied in supine position Other Brace: CAM walker R LE Restrictions Weight Bearing Restrictions: Yes LUE Weight Bearing: Non weight bearing RLE Weight Bearing: Weight bearing as tolerated Other Position/Activity Restrictions: RLE WBAT in CAM boot   Therapy/Group: Individual Therapy  Waunita Schooner 12/19/2018, 12:29 PM
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
## 2018-12-20 ENCOUNTER — Encounter (HOSPITAL_COMMUNITY): Payer: Managed Care, Other (non HMO) | Admitting: Psychology

## 2018-12-20 ENCOUNTER — Inpatient Hospital Stay (HOSPITAL_COMMUNITY): Payer: Managed Care, Other (non HMO)

## 2018-12-20 ENCOUNTER — Inpatient Hospital Stay (HOSPITAL_COMMUNITY): Payer: Managed Care, Other (non HMO) | Admitting: Physical Therapy

## 2018-12-20 MED ORDER — POLYETHYLENE GLYCOL 3350 17 G PO PACK: 17.0000 g | PACK | Freq: Two times a day (BID) | ORAL | 0 refills | Status: DC

## 2018-12-20 MED ORDER — VITAMIN D3 25 MCG PO TABS: 1000.0000 [IU] | ORAL_TABLET | Freq: Every day | ORAL | Status: DC

## 2018-12-20 MED ORDER — ADULT MULTIVITAMIN W/MINERALS CH: 1.0000 | ORAL_TABLET | Freq: Every day | ORAL | Status: DC

## 2018-12-20 MED ORDER — CYANOCOBALAMIN 1000 MCG PO TABS: 1000.0000 ug | ORAL_TABLET | Freq: Every day | ORAL | Status: AC

## 2018-12-20 MED ORDER — ACETAMINOPHEN 325 MG PO TABS: 650.0000 mg | ORAL_TABLET | Freq: Four times a day (QID) | ORAL | Status: DC

## 2018-12-20 MED ORDER — CAMPHOR-MENTHOL 0.5-0.5 % EX LOTN
TOPICAL_LOTION | CUTANEOUS | Status: DC | PRN
  Administered 2018-12-20: 1 via TOPICAL
  Administered 2018-12-21 – 2018-12-23 (×2): via TOPICAL
  Filled 2018-12-20: qty 222

## 2018-12-20 MED ORDER — SENNOSIDES-DOCUSATE SODIUM 8.6-50 MG PO TABS: 2.0000 | ORAL_TABLET | Freq: Every day | ORAL | Status: DC
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
## 2018-12-20 NOTE — Plan of Care (Signed)
  Problem: Consults Goal: RH BRAIN INJURY PATIENT EDUCATION Description: Description: See Patient Education module for eduction specifics Outcome: Progressing   Problem: RH BOWEL ELIMINATION Goal: RH STG MANAGE BOWEL WITH ASSISTANCE Description: STG Manage Bowel with mod I Assistance. Outcome: Progressing Goal: RH STG MANAGE BOWEL W/MEDICATION W/ASSISTANCE Description: STG Manage Bowel with Medication with Mod I Assistance. Outcome: Progressing   Problem: RH BLADDER ELIMINATION Goal: RH STG MANAGE BLADDER WITH ASSISTANCE Description: STG Manage Bladder With Mod I Assistance Outcome: Progressing   Problem: RH SKIN INTEGRITY Goal: RH STG SKIN FREE OF INFECTION/BREAKDOWN Description: Maintain skin free of infection or new breakdown with Mod I assist Outcome: Progressing   Problem: RH SAFETY Goal: RH STG ADHERE TO SAFETY PRECAUTIONS W/ASSISTANCE/DEVICE Description: STG Adhere to Safety Precautions With cues and reminders Outcome: Progressing Goal: RH STG DECREASED RISK OF FALL WITH ASSISTANCE Description: STG Decreased Risk of Fall With cues and remiders Outcome: Progressing   Problem: RH PAIN MANAGEMENT Goal: RH STG PAIN MANAGED AT OR BELOW PT'S PAIN GOAL Description: Pain level less than 4 on scale of 0-10 Outcome: Progressing   Problem: RH KNOWLEDGE DEFICIT BRAIN INJURY Goal: RH STG INCREASE KNOWLEDGE OF SELF CARE AFTER BRAIN INJURY Description: Pt will be able to identify measures to prevent falls and injuries like removing environmental hazards with mod I assist.  Outcome: Progressing

## 2018-12-20 NOTE — Progress Notes (Signed)
Occupational Therapy Session Note  Patient Details  Name: [name] MRN: [id number] Date of Birth: 06-Nov-1965  Today's Date: 12/20/2018 OT Individual Time: 6203-5597 OT Individual Time Calculation (min): 57 min    Short Term Goals: Week 1:  OT Short Term Goal 1 (Week 1): Pt will transfer to toilet with CGA and LRAD OT Short Term Goal 2 (Week 1): Pt will don shirt with MIN A OT Short Term Goal 3 (Week 1): Pt will roll with MOD A to don LSO to decrease burden of care during TLSO application OT Short Term Goal 4 (Week 1): Pt will don B socks with S  Skilled Therapeutic Interventions/Progress Updates:    1;1. Pt received in bed with pain in shoulder 4/10. Rest and head applied PRN. Pt rolls with CGA for managing L shoulder and OT applies TLSO. Pt supine>sitting with A for trunk elevation. Pt ambulates throughout session with quad cane with CGA fading to S overall to transfer onto quad cane. Pt requires A to completes CM for toileting, but is able to complete hygiene after BM with toilet tongs. Pt stands to complete hygiene with A to brush back of head. In dayroom, OT educates and provides handout on energy conservation principles with examples for adapting ADL/IADL. Exited session with pt seated in recliner, call light in reach and all needs met  Therapy Documentation Precautions:  Precautions Precautions: Shoulder, Back, Fall Shoulder Interventions: Shoulder sling/immobilizer(L UE) Precaution Booklet Issued: No Precaution Comments: brace if HOB is >30 degrees, sling to L UE  Required Braces or Orthoses: Spinal Brace, Other Brace, Sling Spinal Brace: Thoracolumbosacral orthotic, Applied in supine position Other Brace: CAM walker R LE Restrictions Weight Bearing Restrictions: Yes LUE Weight Bearing: Non weight bearing RLE Weight Bearing: Weight bearing as tolerated Other Position/Activity Restrictions: RLE WBAT in CAM boot General:   Vital Signs:   Pain: Pain Assessment Pain  Scale: 0-10 Pain Score: 4  ADL:   Vision   Perception    Praxis   Exercises:   Other Treatments:     Therapy/Group: Individual Therapy  Tonny Branch 12/20/2018, 9:13 AM

## 2018-12-20 NOTE — Progress Notes (Addendum)
Contacted Dr. Griffin Basil regarding follow up on left shoulder--they will see her in office with films for follow up after discharge. Contacted Annye Asa PA regarding difficulty with stairs due to CAM boot. He felt that patient  3 weeks out and recommended gentle ROM as tolerated with ASO for support. Can use boot prn. Patient updated.

## 2018-12-20 NOTE — Progress Notes (Signed)
Occupational Therapy Session Note  Patient Details  Name: [name] MRN: [id number] Date of Birth: Mar 15, 1966  Today's Date: 12/20/2018 OT Individual Time: 1300-1400 OT Individual Time Calculation (min): 60 min    Short Term Goals: Week 1:  OT Short Term Goal 1 (Week 1): Pt will transfer to toilet with CGA and LRAD OT Short Term Goal 2 (Week 1): Pt will don shirt with MIN A OT Short Term Goal 3 (Week 1): Pt will roll with MOD A to don LSO to decrease burden of care during TLSO application OT Short Term Goal 4 (Week 1): Pt will don B socks with S      Skilled Therapeutic Interventions/Progress Updates:  Pt received sitting in recliner with ice pack on L shoulder and neck. Pt with no immediate c/o pain and agreeable to tx. Pt is CGA for sit <> stand from recliner. Pt is CGA sitting EOB <> sidelying and reports L shoulder and elbow pain during bed mobility. Pt completes UB bathing lying supine on flat bed with set up and and LB bathing in supine with min A for washing buttocks. Pt completed L and R log rolls with CGA but required total A for washing buttocks and donning TLSO. Pt donned shirt in supine with mod A for clothing manipulation on L UE and back and total A donning TLSO.  Completes LB dressing sitting EOB; attempted to use toilet tongs to assist with pants however, pt became frustrated with use and opted to use R hand. Pt ambulated EOB <> recliner with CGA. End of session pt left in w/c with ice pack on shoulder and neck, call bell in reach and all needs met.      Therapy Documentation Precautions:  Precautions Precautions: Shoulder, Back, Fall Shoulder Interventions: Shoulder sling/immobilizer(L UE) Precaution Booklet Issued: No Precaution Comments: brace if HOB is >30 degrees, sling to L UE  Required Braces or Orthoses: Spinal Brace, Other Brace, Sling Spinal Brace: Thoracolumbosacral orthotic, Applied in supine position Other Brace: CAM walker R LE Restrictions Weight  Bearing Restrictions: Yes LUE Weight Bearing: Non weight bearing RLE Weight Bearing: Weight bearing as tolerated Other Position/Activity Restrictions: RLE WBAT in CAM boot Pain: Pain Assessment Pain Scale: 0-10 Pain Score: 2  Patients Stated Pain Goal: 4      Therapy/Group: Individual Therapy  Novella Abraha 12/20/2018, 4:09 PM
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
## 2018-12-20 NOTE — Progress Notes (Signed)
Sutures were removed from the left elbow & left posterior hand. No c/o pain to the area. Will continue to monitor

## 2018-12-20 NOTE — Progress Notes (Signed)
Physical Therapy Session Note  Patient Details  Name: KATIRIA CALAME MRN: [id number] Date of Birth: 1965-03-24  Today's Date: 12/20/2018 PT Individual Time: 4431-5400 PT Individual Time Calculation (min): 56 min   Short Term Goals: Week 1:  PT Short Term Goal 1 (Week 1): Pt will increase bed mobility to min A. PT Short Term Goal 2 (Week 1): Pt will increase transfers to S. PT Short Term Goal 3 (Week 1): Pt will increase ambulation with LRAD aout 50 feet with S. PT Short Term Goal 4 (Week 1): Pt will ascend/descend 12 stairs with 1 rail and c/g., PT Short Term Goal 5 (Week 1): Pt will propel w/c about 50 feet with S.  Skilled Therapeutic Interventions/Progress Updates:  Pt received in recliner & agreeable to tx. Discussed OT recommendation of hospital bed to assist with bathing at bed level & pt agreeable. Pt completes all sit<>stand transfers from elevated surfaces throughout session with supervision with pt practicing transfers from various surfaces (chairs with & without armrests). Pt practiced transferring sit<>stand from low, compliant couch & requires 2 attempts & mod assist to transfer to standing from low surface; discussed ways to increase seat height and where to sit on couch if pt wishes to do so at home. Pt ambulates around unit with [facility] with supervision with decreased gait speed with focus on endurance training throughout. Pt negotiates 4 steps (6") x 3 trials with SBQC and CGA with instructional cuing for compensatory technique & rest breaks between each trial 2/2 fatigue. Pt completes car transfer at small SUV height with supervision assist, RUE assisting BLE in/out of car, and extra time. At end of session pt left in recliner with all needs in reach.  Therapy Documentation Precautions:  Precautions Precautions: Shoulder, Back, Fall Shoulder Interventions: Shoulder sling/immobilizer(L UE) Precaution Booklet Issued: No Precaution Comments: brace if HOB is >30 degrees, sling to  L UE  Required Braces or Orthoses: Spinal Brace, Other Brace, Sling Spinal Brace: Thoracolumbosacral orthotic, Applied in supine position Other Brace: CAM walker R LE Restrictions Weight Bearing Restrictions: Yes LUE Weight Bearing: Non weight bearing RLE Weight Bearing: Weight bearing as tolerated Other Position/Activity Restrictions: RLE WBAT in CAM boot  Pain: Pt with less behaviors/complaints of pain on this date, rest breaks provided PRN during session.    Therapy/Group: Individual Therapy  Waunita Schooner 12/20/2018, 12:39 PM

## 2018-12-21 ENCOUNTER — Inpatient Hospital Stay (HOSPITAL_COMMUNITY): Payer: Managed Care, Other (non HMO) | Admitting: Physical Therapy

## 2018-12-21 ENCOUNTER — Encounter (HOSPITAL_COMMUNITY): Payer: Self-pay

## 2018-12-21 ENCOUNTER — Inpatient Hospital Stay (HOSPITAL_COMMUNITY): Payer: Managed Care, Other (non HMO)

## 2018-12-21 MED ORDER — PNEUMOCOCCAL VAC POLYVALENT 25 MCG/0.5ML IJ INJ
0.5000 mL | INJECTION | INTRAMUSCULAR | Status: AC
  Administered 2018-12-22: 0.5 mL via INTRAMUSCULAR
  Filled 2018-12-21: qty 0.5

## 2018-12-21 NOTE — Progress Notes (Signed)
Physical Therapy Session Note  Patient Details  Name: [name] MRN: [id number] Date of Birth: [birth year]  Today's Date: 12/21/2018 PT Individual Time: 1100-1200 PT Individual Time Calculation (min): 60 min   Short Term Goals: Week 2:  PT Short Term Goal 1 (Week 2): STG = LTG due to estimated d/c date.  Skilled Therapeutic Interventions/Progress Updates:   Pt received supine in bed and agreeable to PT. PT assisted pt to don TLSO with Rolling R and L. Supine>sit transfer with CGA assist and cues for UE placement to prevent pain in the LUE. Stand pivot transfer to [facility] with QC and CGA. Pt transported to rehab gym in [facility]. Gait training with QC and SPC x 144f each. CGA with QC and supervision assist SPC due to poor AD management with QC.  Dynamic gait training to weave through 2 cones and step over 2 1 inch obstacles with QC and CGA from PT for safety and cues for step length.  Pt reports dizziness with looking up/down. PT instructed pt in VOR habituation exercised 1 and 2, 2 x 30 sec each with moderate cues for decreased speed and ROM to decrease severity of s/s .   Additional gait training with QC x 1230fwith supervision assist and min cues for safety in turns. Patient returned to [facility] and [facility] and all needs met.           Therapy Documentation Precautions:  Precautions Precautions: Shoulder, Back, Fall Shoulder Interventions: Shoulder sling/immobilizer Precaution Booklet Issued: No Precaution Comments: brace if HOB is >30 degrees, sling to L UE  Required Braces or Orthoses: Spinal Brace, Other Brace, Sling Spinal Brace: Thoracolumbosacral orthotic, Applied in supine position Other Brace: ASO R ankle Restrictions Weight Bearing Restrictions: Yes LUE Weight Bearing: Non weight bearing RLE Weight Bearing: Weight bearing as tolerated(with ASO brace) Other Position/Activity Restrictions: with ASO brace Pain: Pain Assessment Pain Scale:  0-10 Pain Score: 3  Pain Type: Acute pain Pain Location: Shoulder Pain Orientation: Right Pain Descriptors / Indicators: Aching;Discomfort Pain Frequency: Intermittent Pain Onset: Gradual Patients Stated Pain Goal: 0 Pain Intervention(s): Medication (See eMAR)(pre-medicate for therapy)    Therapy/Group: Individual Therapy  AuLorie Phenix/30/2020, 2:22 PM

## 2018-12-21 NOTE — Progress Notes (Signed)
Physical Therapy Session Note  Patient Details  Name: [name] MRN: [id number] Date of Birth: [birth year]  Today's Date: 12/21/2018 PT Individual Time: 4008-6761 PT Individual Time Calculation (min): 71 min   Short Term Goals: Week 2:  PT Short Term Goal 1 (Week 2): STG = LTG due to estimated d/c date.  Skilled Therapeutic Interventions/Progress Updates:  Pt received in recliner & agreeable to tx. Pt reports [employer] is modifying her ASO to fit her calf, PA (Pam) reports pt can participate in therapy without cam boot while wearing tennis shoes without ASO. Pt ambulates room>gym with QC & supervision. Provided pt with SPC and pt ambulates with that AD the rest of the session with supervision as well. Pt ambulates gym<>room, around room, and around gym with focus on endurance training. Contacted Randy at [employer] to ensure they are bringing the proper orthosis. In room, pt transfers to supine with bed rails with supervision and cuing for log rolling technique. Pt rolls R with supervision and use of bed rails to allow therapist to adjust TLSO as it was crooked. Pt returns to sitting EOB with supervision. Pt with ongoing c/o dizziness with bed mobility and when looking down to descend stairs so have asked for vestibular eval this Friday. In gym, pt negotiates 4 steps with [facility] & supervision to ascend but min assist to descend with 1 LOB 2/2 dizziness. Pt negotiates 4 steps a 2nd time with min assist overall with SPC. Pt negotiates 4 steps a 3rd time with [facility] with CGA. Pt & therapist are in agreement for pt to use [facility] for stair negotiation & SPC for gait. At end of session pt left in recliner with all needs in reach.  Pt also practices sit<>stand from various surfaces with SPC with instructional cuing for technique.   Pain: pt reports 3/10 pain to RN. Pt c/o pain in posterior shoulder & L calf hematoma.   Therapy Documentation Precautions:  Precautions Precautions: Shoulder, Back,  Fall Shoulder Interventions: Shoulder sling/immobilizer Precaution Booklet Issued: No Precaution Comments: brace if HOB is >30 degrees, sling to L UE  Required Braces or Orthoses: Spinal Brace, Other Brace, Sling Spinal Brace: Thoracolumbosacral orthotic, Applied in supine position Other Brace: ASO R ankle Restrictions Weight Bearing Restrictions: Yes LUE Weight Bearing: Non weight bearing RLE Weight Bearing: Weight bearing as tolerated(with ASO brace)    Therapy/Group: Individual Therapy  Waunita Schooner 12/21/2018, 2:23 PM

## 2018-12-21 NOTE — Progress Notes (Signed)
[place] PHYSICAL MEDICINE & REHABILITATION PROGRESS NOTE   Subjective/Complaints: Didn't sleep well. Back was itching, sl rash. Pain controlled  ROS: Patient denies fever, rash, sore throat, blurred vision, nausea, vomiting, diarrhea, cough, shortness of breath or chest pain,  headache, or mood change. .    Objective:   No results found. No results for input(s): WBC, HGB, HCT, PLT in the last 72 hours. Recent Labs    12/19/18 0520  NA 139  K 4.1  CL 103  CO2 27  GLUCOSE 94  BUN 9  CREATININE 0.93  CALCIUM 8.5*    Intake/Output Summary (Last 24 hours) at 12/21/2018 0821 Last data filed at 12/20/2018 1900 Gross per 24 hour  Intake 360 ml  Output -  Net 360 ml     Physical Exam: Vital Signs Blood pressure 119/68, pulse 68, temperature 98 F (36.7 C), temperature source Oral, resp. rate 16, height 5\' 7"  (1.702 m), weight (!) 138.2 kg, SpO2 94 %. Constitutional: No distress . Vital signs reviewed. HEENT: EOMI, oral membranes moist Neck: supple Cardiovascular: RRR without murmur. No JVD    Respiratory: CTA Bilaterally without wheezes or rales. Normal effort    GI: BS +, non-tender, non-distended  Musculoskeletal:        General: Edema present. LLE    Comments: Left upper extremity edema with tenderness in sling--improving.  Neurological: alert, follows commands. Motor: Right upper extremity: 5/5 proximal distal Left upper extremity: Proximally limited dt pain, handgrip 4+/5 Upper extremity: 4+/5 Right lower extremity: 4+/5 proximal to distal  Skin: scattered abrasions. Bruises LUE BLE. Sutures out Psychiatric: pleasant    Assessment/Plan: 1. Functional deficits secondary to polytrauma which require 3+ hours per day of interdisciplinary therapy in a comprehensive inpatient rehab setting.  Physiatrist is providing close team supervision and 24 hour management of active medical problems listed below.  Physiatrist and rehab team continue to assess barriers to  discharge/monitor patient progress toward functional and medical goals  Care Tool:  Bathing    Body parts bathed by patient: Right arm, Left arm, Chest, Abdomen, Front perineal area, Right upper leg, Left upper leg, Right lower leg, Left lower leg, Face   Body parts bathed by helper: Buttocks     Bathing assist Assist Level: Minimal Assistance - Patient > 75%     Upper Body Dressing/Undressing Upper body dressing Upper body dressing/undressing activity did not occur (including orthotics): Safety/medical concerns What is the patient wearing?: Pull over shirt, Orthosis    Upper body assist Assist Level: Maximal Assistance - Patient 25 - 49%    Lower Body Dressing/Undressing Lower body dressing    Lower body dressing activity did not occur: N/A(patient not using incontinent brief) What is the patient wearing?: Pants     Lower body assist Assist for lower body dressing: Maximal Assistance - Patient 25 - 49%     Toileting Toileting    Toileting assist Assist for toileting: Maximal Assistance - Patient 25 - 49%     Transfers Chair/bed transfer  Transfers assist  Chair/bed transfer activity did not occur: Safety/medical concerns  Chair/bed transfer assist level: Contact Guard/Touching assist     Locomotion Ambulation   Ambulation assist      Assist level: Supervision/Verbal cueing Assistive device: Cane-quad Max distance: >150 ft   Walk 10 feet activity   Assist     Assist level: Supervision/Verbal cueing Assistive device: Cane-quad   Walk 50 feet activity   Assist Walk 50 feet with 2 turns activity did not occur:  Safety/medical concerns  Assist level: Supervision/Verbal cueing Assistive device: Cane-quad    Walk 150 feet activity   Assist Walk 150 feet activity did not occur: Safety/medical concerns  Assist level: Supervision/Verbal cueing Assistive device: Cane-quad    Walk 10 feet on uneven surface  activity   Assist Walk 10 feet on  uneven surfaces activity did not occur: Safety/medical concerns         Wheelchair     Assist     Wheelchair activity did not occur: Safety/medical concerns         Wheelchair 50 feet with 2 turns activity    Assist    Wheelchair 50 feet with 2 turns activity did not occur: Safety/medical concerns       Wheelchair 150 feet activity     Assist  Wheelchair 150 feet activity did not occur: Safety/medical concerns       Blood pressure 119/68, pulse 68, temperature 98 F (36.7 C), temperature source Oral, resp. rate 16, height 5\' 7"  (1.702 m), weight (!) 138.2 kg, SpO2 94 %.  Medical Problem List and Plan: 1.  Deficits with mobility, transfers, endurance, self-care secondary to polytrauma.            -Continue CIR therapies including PT, OT   -ELOS 10/6   -advanced out of boot RLE---> ASO 2.  Antithrombotics: -DVT/anticoagulation:  Pharmaceutical: Other (comment)--Eliquis             -antiplatelet therapy:  3. Pain Management: Oxycodone prn severe pain and ultram prn moderate pain. Heat/Ice prn              Monitor with increased mobility 4. Mood: Team to provide ego support--lot of personal stressors. LCSW to follow for evaluation and support.              -antipsychotic agents: N/A 5. Neuropsych: This patient is capable of making decisions on her own behalf. 6. Skin/Wound Care: Routine pressure relief measures. Monitor wounds for healing.   -suture out  -recommended scheduling hydrocortisone and sarna to stay ahead of rash and pruritus on back. Also needs to keep area dry/cool 7. Fluids/Electrolytes/Nutrition: Monitor I/O. Offer supplements prn po intake. Multivitamin daily.                8.  Left humerus fracture s/p ORIF: continue NWB with sling.    PT to start after 4 weeks.  -ortho f/u after discharge 9. T 5 compression fracture: TLSO if HOB>30 degrees  10. PAF: Continue metoprolol and on Eliquis.              HR controlled 11. OSA: Has been  noncompliant with CPAP use--encourage use during stay.  10. ABLA:              hgb 10.1  Continue to follow.  14. H/o depression:  Continue prozac with trazodone prn for insomnia. Xanax prn for anxiety.  15. H/o asthma/Multiple rib fractures:               Pulmonary toilet.  16. Constipation:   Senna S 2 at bedtime in addition to miralax bid-.   -results with sorbitol---continue mtc regimen   17. H/o gastric bypass:  multivitamin, Vitamin B 12 and Vitamin D. Has issues with diarrhea PTA     LOS: 5 days A FACE TO FACE EVALUATION WAS PERFORMED  12/21/2018, 8:21 AM

## 2018-12-21 NOTE — Progress Notes (Signed)
Physical Therapy Weekly Progress Note  Patient Details  Name: LORELLE MACALUSO MRN: [id number] Date of Birth: 05/14/1965  Beginning of progress report period: December 17, 2018 End of progress report period: December 21, 2018  Today's Date: 12/21/2018   Patient has met 3 of 4 short term goals (wheelchair goal discontinued as pt is ambulatory).  Pt is making great progress towards LTG's as she currently ambulates long distances with supervision and quad cane, and negotiates steps with quad cane & CGA. Pt would benefit from continued skilled PT treatment to focus on increasing independence with gait & stair negotiation, and to focus on balance & d/c planning. Pt also needs continued practice with bed mobility as she has difficulty 2/2 need to don TLSO in supine & back precautions.  Patient continues to demonstrate the following deficits muscle weakness, decreased cardiorespiratoy endurance, vestibular issues, and decreased standing balance, decreased postural control, decreased balance strategies and difficulty maintaining precautions and therefore will continue to benefit from skilled PT intervention to increase functional independence with mobility.  Patient progressing toward long term goals..  Continue plan of care.  PT Short Term Goals Week 1:  PT Short Term Goal 1 (Week 1): Pt will increase bed mobility to min A. PT Short Term Goal 1 - Progress (Week 1): Progressing toward goal PT Short Term Goal 2 (Week 1): Pt will increase transfers to S. PT Short Term Goal 2 - Progress (Week 1): Met PT Short Term Goal 3 (Week 1): Pt will increase ambulation with LRAD aout 50 feet with S. PT Short Term Goal 3 - Progress (Week 1): Met PT Short Term Goal 4 (Week 1): Pt will ascend/descend 12 stairs with 1 rail and c/g., PT Short Term Goal 4 - Progress (Week 1): Met PT Short Term Goal 5 (Week 1): Pt will propel w/c about 50 feet with S. PT Short Term Goal 5 - Progress (Week 1): Discontinued  (comment)(pt ambulatory) Week 2:  PT Short Term Goal 1 (Week 2): STG = LTG due to estimated d/c date.    Therapy Documentation Precautions:  Precautions Precautions: Shoulder, Back, Fall Shoulder Interventions: Shoulder sling/immobilizer(L UE) Precaution Booklet Issued: No Precaution Comments: brace if HOB is >30 degrees, sling to L UE  Required Braces or Orthoses: Spinal Brace, Other Brace, Sling Spinal Brace: Thoracolumbosacral orthotic, Applied in supine position Other Brace: CAM walker R LE Restrictions Weight Bearing Restrictions: Yes LUE Weight Bearing: Non weight bearing RLE Weight Bearing: Weight bearing as tolerated Other Position/Activity Restrictions: RLE WBAT in CAM boot   Therapy/Group: Individual Therapy  Waunita Schooner 12/21/2018, 8:31 AM

## 2018-12-21 NOTE — Progress Notes (Signed)
Occupational Therapy Session Note  Patient Details  Name: [name] MRN: [id number] Date of Birth: 1965/12/19  Today's Date: 12/21/2018 OT Individual Time: 4128-7867 OT Individual Time Calculation (min): 60 min    Short Term Goals: Week 1:  OT Short Term Goal 1 (Week 1): Pt will transfer to toilet with CGA and LRAD OT Short Term Goal 2 (Week 1): Pt will don shirt with MIN A OT Short Term Goal 3 (Week 1): Pt will roll with MOD A to don LSO to decrease burden of care during TLSO application OT Short Term Goal 4 (Week 1): Pt will don B socks with S  Skilled Therapeutic Interventions/Progress Updates:    Pt received in bed supine reporting need to void BM, with no initial c/o pain. Pt comes EOB with (S) and ambulates to toilet with quad cane with CGA. Pt voided continent BM and is (S) for toileting hygiene. Pt completes LB bathing with (S) in bathroom seated in [facility] and use of long handled sponge for reaching B LE. Pt completes UB bathing supine in bed with min A and bed flattened; requiring A for washing R arm and armpit d/t pain in L shoulder. Pt is total A for donning TSLO and L UE sling and min A for LB dressing with use of tongs for pulling pants up around waist. End of session pt left supine in bed, bed alarm set and all needs met.   Therapy Documentation Precautions:  Precautions Precautions: Shoulder, Back, Fall Shoulder Interventions: Shoulder sling/immobilizer(L UE) Precaution Booklet Issued: No Precaution Comments: brace if HOB is >30 degrees, sling to L UE  Required Braces or Orthoses: Spinal Brace, Other Brace, Sling Spinal Brace: Thoracolumbosacral orthotic, Applied in supine position Other Brace: CAM walker R LE Restrictions Weight Bearing Restrictions: Yes LUE Weight Bearing: Non weight bearing RLE Weight Bearing: Weight bearing as tolerated Other Position/Activity Restrictions: RLE WBAT in CAM boot         Therapy/Group: Individual Therapy  Olliver Boyadjian 12/21/2018, 12:30 PM

## 2018-12-21 NOTE — Progress Notes (Signed)
Orthopedic Tech Progress Note Patient Details:  [name] 10/06/1965 [id number]  Patient ID: [name], female   DOB: 09-20-1965, 53 y.o.   MRN: [id number]   [name] 12/21/2018, 8:44 AMCalled [employer] for ankle,foot orthosis.

## 2018-12-22 ENCOUNTER — Inpatient Hospital Stay (HOSPITAL_COMMUNITY): Payer: Managed Care, Other (non HMO)

## 2018-12-22 NOTE — Progress Notes (Signed)
Social Work Patient ID: [name], female   DOB: 1966/01/16, 53 y.o.   MRN: [id number]   Have reviewed team conference with pt who is aware and agreeable with target d/c of 10/6 and targeted goals.  Continue to follow.  Duriel Deery, LCSW

## 2018-12-22 NOTE — Progress Notes (Signed)
Physical Therapy Session Note  Patient Details  Name: [name] MRN: [id number] Date of Birth: 1965-11-09  Today's Date: 12/22/2018 PT Individual Time: 1003-1103 PT Individual Time Calculation (min): 60 min   Short Term Goals: Week 2:  PT Short Term Goal 1 (Week 2): STG = LTG due to estimated d/c date.  Skilled Therapeutic Interventions/Progress Updates:    Education provided on use of breath and pain management following up from conversation with Rec Therapist. Assisted patient with donning of shoes and ASO. Pt request to work on stairs and car transfers this session to prepare for discharge. Functional ambulation on unit with SPC at overall supervision to occasional CGA with turns for safety due to intermittent vertigo symptoms with head turns, good cadence noted and no issues with ASO per report or instability in ankle. Stair negotiation training for home access with NBQC x 4 steps x 2 reps, 8 short steps x 1 rep, and 8" curb step all with overall CGA with pt verbally sequencing pattern. Discussed energy conservation techniques throughout and self pacing. Simulated car transfer to sedan height (camry ~ 19") with CGA and verbal cues for technique. Reports one family member has F35 (simulated 1" height) but we determined this would be unsafe due to multiple WB restrictions and back precautions due to seat height - pt inagreement. Then trialled 29" height for SUV midsize which is another option and performed at same CGA level. Recommending either car or SUV for d/c and pt in agreement. Low couch transfer per pt request as this is what she will be able to sit in at home upon d/c. Required CGA overall today for transfer and reports improvement since last time attempted. Educated on building up surfaces when too low to decrease effort/burden and also educated on use of breath during exertion. Pt making overall excellent progress towards goals and continues to require rest breaks due to fatigue.    Therapy Documentation Precautions:  Precautions Precautions: Shoulder, Back, Fall Shoulder Interventions: Shoulder sling/immobilizer Precaution Booklet Issued: No Precaution Comments: brace if HOB is >30 degrees, sling to L UE  Required Braces or Orthoses: Spinal Brace, Other Brace, Sling Spinal Brace: Thoracolumbosacral orthotic, Applied in supine position Other Brace: ASO R ankle Restrictions Weight Bearing Restrictions: Yes LUE Weight Bearing: Non weight bearing RLE Weight Bearing: Weight bearing as tolerated Other Position/Activity Restrictions: with ASO brace   Pain: reports pain in back and R shoulder    Therapy/Group: Individual Therapy  Canary Brim Ivory Broad, PT, DPT, CBIS  12/22/2018, 11:17 AM
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
## 2018-12-22 NOTE — Progress Notes (Signed)
Occupational Therapy Session Note  Patient Details  Name: [name] MRN: [id number] Date of Birth: 07/26/1965  Today's Date: 12/22/2018 OT Individual Time: 4098-1191  OT Individual Time Calculation (min): 45 min   Session 2: OT Individual Time: 1400-1500 OT Individual Time Calculation (min): 60 min    Session 3: OT Individual Time: 1600-1640 OT Individual Time Calculation (min): 40 min  Short Term Goals: Week 1:  OT Short Term Goal 1 (Week 1): Pt will transfer to toilet with CGA and LRAD OT Short Term Goal 2 (Week 1): Pt will don shirt with MIN A OT Short Term Goal 3 (Week 1): Pt will roll with MOD A to don LSO to decrease burden of care during TLSO application OT Short Term Goal 4 (Week 1): Pt will don B socks with S     Skilled Therapeutic Interventions/Progress Updates:   Session 1: Pt received asleep in bed supine, easily awoken and ready to begin tx. Pt reporting a good nights sleep and very proud of progress she made yesterday. Pt log rolls L and R with (S) to don TLSO, completes sidelying <>EOB with CGA. Pt reporting needs to void BM. Ambulated to toilet with CGA and quad cane and voided continent BM. Pt is CGA for toileting with use of toilet tongs for buttocks hygiene. Pt reports she would like to bath LB while seated on bariatric BSC in shower. D/t body habitus pt requires a bariatric BSC. Pt's UB was kept dry and TLSO intact throughout. Pt completed LB bathing with (S) and CGA when standing to wash buttocks using long handled sponge. Pt ambulated back to EOB and completed LB dressing with (S). Exited session with pt left in recline, TSLO and sling donned, breakfast tray nearby and all needs met.   Session 2: Pt received in chair with RN present in room. Pt ready for tx with OT. Pt sit <>stand with CGA. Pt completes functional short distance mobility to w/c with CGA for oral care sitting at the sink with (S). Pt hair was washed at sink level using hair washing tray to  increase self value and mood d/t pt inability to shower. Pt transitioned to supine in bed and doffed TLSO in preparation for bathing. UB bathing completed in supine with min A for washing R UE and armpit. OT engaged pt in role play exercise for directing TSLO management at bed level in preparation for d/c. Pt completes log rolling R and L with (S) and is total A for donning LUE sling and mod A for UB dressing. Exited session with pt in bed flat and supine to maintain precautions with arm sling on, call bell in reach and all needs met.   Session 3: Pt received in bed flat supine resting; ready for tx with OT. Pt reports needing to void urine. TLSO donned in supine with total A. Completes functional small distance mobility with quad cane to toilet with CGA. Pt is CGA for toileting. Pt completes functional mobility to therapy apartment, approx 200', with quad cane and CGA to engage in kitchen education in preparation for d/c home. OT educated pt on strategies for maintaining back precautions when completing meal prep at home. OT encouraged pt to place all commonly used kitchen supplies and items in low cabinets or on counter top to increase accessibility. General education regarding IADL engagement at home provided with focus on energy conservation strategies, fall prevention, and adherence to precautions. Pt very receptive to all edu and asked questions throughout session.  Pt returned to room, left sitting up in w/c and call bell in reach.      Therapy Documentation Precautions:  Precautions Precautions: Shoulder, Back, Fall Shoulder Interventions: Shoulder sling/immobilizer Precaution Booklet Issued: No Precaution Comments: brace if HOB is >30 degrees, sling to L UE  Required Braces or Orthoses: Spinal Brace, Other Brace, Sling Spinal Brace: Thoracolumbosacral orthotic, Applied in supine position Other Brace: ASO R ankle Restrictions Weight Bearing Restrictions: Yes LUE Weight Bearing: Non weight  bearing RLE Weight Bearing: Weight bearing as tolerated Other Position/Activity Restrictions: with ASO brace          Therapy/Group: Individual Therapy  Kye Hedden 12/22/2018, 12:19 PM
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
## 2018-12-23 ENCOUNTER — Inpatient Hospital Stay (HOSPITAL_COMMUNITY): Payer: Managed Care, Other (non HMO) | Admitting: Occupational Therapy

## 2018-12-23 ENCOUNTER — Inpatient Hospital Stay (HOSPITAL_COMMUNITY): Payer: Managed Care, Other (non HMO) | Admitting: Physical Therapy

## 2018-12-23 ENCOUNTER — Inpatient Hospital Stay (HOSPITAL_COMMUNITY): Payer: Managed Care, Other (non HMO) | Admitting: *Deleted

## 2018-12-23 NOTE — Progress Notes (Signed)
Recreational Therapy Session Note  Patient Details  Name: [name] MRN: [id number] Date of Birth: [birth year] Today's Date: 12/23/2018 Time:  1032-1059 Pain: c/o pain, un-rated, had called nurse for meds, prior to session and nurse in at end of session for administration.  Repositioning offered some relief. Skilled Therapeutic Interventions/Progress Updates: Session today continued with a focus on relaxation training to assist with stress management and pain management..  Handouts provided on diaphragamatic breathing and activity analysis.  Encouraged and discussed ways to incorporate these into her routine.  Pt agreeable and appreciative, plans to implement.  Pt completes these strategies with independence. Therapy/Group: Individual Therapy  Jasean Ambrosia 12/23/2018, 9:52 AM
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
## 2018-12-23 NOTE — Progress Notes (Signed)
Recreational Therapy Discharge Summary Patient Details  Name: [name] MRN: [id number] Date of Birth: 12/09/1965 Today's Date: 12/23/2018  Long term goals set: 1  Long term goals met: 1  Comments on progress toward goals: TR sessions focused on relaxation training, activity analysis with potential modifications.  Verbal instruction, demonstration and handouts provided.  Pt is independent with these techniques and appreciative of information.   Pt states plans to use these strategies at home as she continues to recover with hopes of implementing their use on a routine basis.  Goal met Reasons for discharge: treatment goals met  Patient/family agrees with progress made and goals achieved: Yes  Ivey Nembhard 12/23/2018, 11:22 AM

## 2018-12-23 NOTE — Progress Notes (Signed)
Physical Therapy Session Note  Patient Details  Name: [name] MRN: [id number] Date of Birth: 11-13-1965  Today's Date: 12/23/2018 PT Individual Time: 1107-1200 PT Individual Time Calculation (min): 53 min   Short Term Goals: Week 2:  PT Short Term Goal 1 (Week 2): STG = LTG due to estimated d/c date.  Skilled Therapeutic Interventions/Progress Updates:  Pt received in recliner & agreeable to tx.  Pt reports "hardly any" pain with movement on this date. Assisted pt with donning tennis shoes. Pt transfers sit<>stand from various surfaces (recliner in room, rocking recliner & low compliant couch in apartment) with supervision overall. In apartment, problem solved with pt & had pt perform retrieving items from refrigerator & transporting them to table with use of bag, [facility] & supervision overall. Educated pt on inability to lift/carry items with LUE 2/2 shoulder restrictions, and reviewed need to place items on higher shelves in refrigerator & easily accessible locations to prevent pt from bending or squatting to retrieve items. Also discussed need for family to assist pt with preparing as much food & other items as possible so pt will not have to prepare so much for herself during the day at mealtime. Recommended pt carry cell phone on her at all times, and suggested placing string/loop on reacher so pt could carry it with her from room to room in case she has to retrieve something off floor. Discussed HHPT f/u with pt. At end of session pt left in recliner with all needs in reach.    Therapy Documentation Precautions:  Precautions Precautions: Shoulder, Back, Fall Shoulder Interventions: Shoulder sling/immobilizer Precaution Booklet Issued: No Precaution Comments: brace if HOB is >30 degrees, sling to L UE  Required Braces or Orthoses: Spinal Brace, Other Brace, Sling Spinal Brace: Thoracolumbosacral orthotic, Applied in supine position Other Brace: ASO R ankle Restrictions Weight  Bearing Restrictions: Yes LUE Weight Bearing: Non weight bearing RLE Weight Bearing: Weight bearing as tolerated Other Position/Activity Restrictions: with ASO brace    Therapy/Group: Individual Therapy  Waunita Schooner 12/23/2018, 12:27 PM

## 2018-12-23 NOTE — Progress Notes (Signed)
Occupational Therapy Session Note  Patient Details  Name: [name] MRN: [id number] Date of Birth: [birth year]  Today's Date: 12/23/2018 OT Individual Time: 0800-0903 OT Individual Time Calculation (min): 63 min    Short Term Goals: Week 1:  OT Short Term Goal 1 (Week 1): Pt will transfer to toilet with CGA and LRAD OT Short Term Goal 2 (Week 1): Pt will don shirt with MIN A OT Short Term Goal 3 (Week 1): Pt will roll with MOD A to don LSO to decrease burden of care during TLSO application OT Short Term Goal 4 (Week 1): Pt will don B socks with S  Skilled Therapeutic Interventions/Progress Updates:    Pt seen for vestibular evaluation during session secondary to ongoing dizziness with positional changes.  Pt seen in bed in supine to start.  Total assist for donning TLSO rolling in the bed side to side, with min assist for rolling to the right and supervision to the left.  Pt reports increased dizziness with transitional movements supine to sit as well as when sitting when looking up with head movement or looking down, with dizziness lasting short duration.  With pt in supine horizontal canals were tested bilaterally with negative outcomes of nystagmus.  Next anterior and posterior canals were tested using Dix-Hallpike method with pt demonstrating positive reaction with testing to the right and pt reporting dizziness lasting less than 10 seconds.  When tested to the left pt reported slight dizziness, but not anywhere as extensive or as long duration as on the right. Minimal nystagmus noted with examination on either side initially.  Therapist completed Epley maneuver for the right ear with increased noted nystagmus through the process as well as increased dizziness.  Upon completion and return to supine, pt reported a decrease in her symptoms with re-test of Marye Round to the right.  Completed Epley Maneuver beginning on the right again, with less intensity of dizziness reported during the  positional changes and transition to sitting.  Once in sitting had pt complete, occular ROM, and head thrust test.  Pt demonstrates occular AROM WFLS bilaterally as well as tracking.  Head thrust test was positive with corrective saccade needed with quick head turn to the left.  Right side testing was normal.  Pt was able to complete gaze stabilization horizontal movements to fixed target with slight increased dizziness still present as well.  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  Recommend continued gaze stabilization exercises X 1, 2-3 times daily as she has already been completing with progression of difficulty as she tolerates.  Also recommend continued work on static and dynamic balance with reduction in somatosensory input as well as decreased visual input in order to increase reliance on the vestibular system.  Continue monitoring symptoms of dizziness as she may also need further Dix-Hallpike evaluation and treatment for BPPV as well if initial maneuvers do not completely resolve that component of her dizziness.  Finished session with pt transferring to the toilet with supervision and use of the quad cane and pt being left with NT to finish.    Therapy Documentation Precautions:  Precautions Precautions: Shoulder, Back, Fall Shoulder Interventions: Shoulder sling/immobilizer Precaution Booklet Issued: No Precaution Comments: brace if HOB is >30 degrees, sling to L UE  Required Braces or Orthoses: Spinal Brace, Other Brace, Sling Spinal Brace: Thoracolumbosacral orthotic, Applied in supine position  Other Brace: ASO R ankle Restrictions Weight Bearing Restrictions: Yes LUE Weight Bearing: Non weight bearing RLE Weight Bearing: Weight bearing as tolerated Other Position/Activity Restrictions: with ASO brace   Pain: Pain Assessment Pain Scale: 0-10 Pain Score: 4   Faces Pain Scale: Hurts little more Pain Type: Acute pain Pain Location: Back Pain Orientation: Right;Posterior Pain Descriptors / Indicators: Aching;Sharp Pain Frequency: Intermittent Pain Onset: On-going Patients Stated Pain Goal: 0 Pain Intervention(s): Medication (See eMAR) ADL: See Care Tool Section for some details of ADL  Therapy/Group: Individual Therapy  Evren Shankland OTR/L 12/23/2018, 12:28 PM

## 2018-12-24 ENCOUNTER — Inpatient Hospital Stay (HOSPITAL_COMMUNITY): Payer: Managed Care, Other (non HMO) | Admitting: Physical Therapy

## 2018-12-24 ENCOUNTER — Inpatient Hospital Stay (HOSPITAL_COMMUNITY): Payer: Managed Care, Other (non HMO)

## 2018-12-24 MED ORDER — ESTRADIOL 0.1 MG/24HR TD PTWK
0.1000 mg | MEDICATED_PATCH | TRANSDERMAL | Status: DC
  Administered 2018-12-25: 20:00:00 0.1 mg via TRANSDERMAL
  Filled 2018-12-24 (×2): qty 1

## 2018-12-24 NOTE — Progress Notes (Signed)
Slept good. Pain managed with scheduled meds, thus far this shift. C/O pain to right scapula. Few steris to left shoulder incision. Sling in place. Multi bruising all over. Left calf and shin bruised with erythema and swelling, hot to touch. Declined scheduled miralax and senna s. Using female urinal at [facility] with staff assistance.Patrici Ranks A

## 2018-12-24 NOTE — Progress Notes (Addendum)
Physical Therapy Session Note  Patient Details  Name: [name] MRN: [id number] Date of Birth: 1965-07-12  Today's Date: 12/24/2018 PT Individual Time: 1345-1415 PT Individual Time Calculation (min): 30 min  PT Missed Time: 15 min Missed Time Reason: CT scan  Short Term Goals: Week 2:  PT Short Term Goal 1 (Week 2): STG = LTG due to estimated d/c date.  Skilled Therapeutic Interventions/Progress Updates:    Pt received seated in recliner in room. Pt very emotional with regards to LLE erythema this PM. Per RN pt needs to have LLE ACE wrapped for compression. Provided compression ACE wrap to LLE for edema management. Per order pt also needs to have LLE elevated. Pt very emotional about this and becomes tearful with thought of being bed-bound. Education with patient that she can have LE elevated while seated in recliner. Attempt to elevate recliner leg rests, pt unable to tolerate 2/2 BLE bruising and skin sensitivity on posterior LE. Provided chair for pt to position BLE on with good tolerance. Provided emotional support to pt throughout session. Once pt comfortably positioned in recliner transport arrives to take her off floor to CT. Stand pivot transfer recliner to w/c with Supervision. Pt left seated in w/c in care of transport team. Pt missed 15 min of scheduled therapy services due to CT scan.  Therapy Documentation Precautions:  Precautions Precautions: Shoulder, Back, Fall Shoulder Interventions: Shoulder sling/immobilizer Precaution Booklet Issued: No Precaution Comments: brace if HOB is >30 degrees, sling to L UE  Required Braces or Orthoses: Spinal Brace, Other Brace, Sling Spinal Brace: Thoracolumbosacral orthotic, Applied in supine position Other Brace: ASO R ankle Restrictions Weight Bearing Restrictions: Yes LUE Weight Bearing: Non weight bearing RLE Weight Bearing: Weight bearing as tolerated Other Position/Activity Restrictions: with ASO brace    Therapy/Group:  Individual Therapy   Excell Seltzer, PT, DPT  12/24/2018, 3:42 PM

## 2018-12-24 NOTE — Progress Notes (Addendum)
Patient not ready for CPAP at this time. RN at bedside said she would place patient on CPAP after meds in the next hour. CPAP on standby at bedside.
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
## 2018-12-25 ENCOUNTER — Inpatient Hospital Stay (HOSPITAL_COMMUNITY): Payer: Managed Care, Other (non HMO) | Admitting: Anesthesiology

## 2018-12-25 ENCOUNTER — Encounter (HOSPITAL_COMMUNITY): Payer: Self-pay

## 2018-12-25 ENCOUNTER — Inpatient Hospital Stay: Admit: 2018-12-25 | Payer: Managed Care, Other (non HMO) | Admitting: Orthopedic Surgery

## 2018-12-25 ENCOUNTER — Encounter (HOSPITAL_COMMUNITY)
Admission: RE | Disposition: A | Discharge status: Home Health | Payer: Self-pay | Source: Intra-hospital | Attending: Physical Medicine & Rehabilitation

## 2018-12-25 HISTORY — PX: I & D EXTREMITY: SHX5045

## 2018-12-25 LAB — SURGICAL PCR SCREEN
MRSA, PCR: NEGATIVE
Staphylococcus aureus: NEGATIVE

## 2018-12-25 SURGERY — IRRIGATION AND DEBRIDEMENT EXTREMITY
Anesthesia: General | Laterality: Left

## 2018-12-25 MED ORDER — ENSURE PRE-SURGERY PO LIQD
296.0000 mL | Freq: Once | ORAL | Status: AC
  Administered 2018-12-25: 12:00:00 296 mL via ORAL
  Filled 2018-12-25: qty 296

## 2018-12-25 MED ORDER — OXYCODONE HCL 5 MG PO TABS: 5.0000 mg | ORAL_TABLET | Freq: Once | ORAL | Status: DC | PRN

## 2018-12-25 MED ORDER — TRANEXAMIC ACID 1000 MG/10ML IV SOLN
INTRAVENOUS | Status: DC | PRN
  Administered 2018-12-25: 1000 mg via INTRAVENOUS

## 2018-12-25 MED ORDER — DEXAMETHASONE SODIUM PHOSPHATE 10 MG/ML IJ SOLN
INTRAMUSCULAR | Status: DC | PRN
  Administered 2018-12-25: 4 mg via INTRAVENOUS

## 2018-12-25 MED ORDER — PROMETHAZINE HCL 25 MG/ML IJ SOLN: 6.2500 mg | INTRAMUSCULAR | Status: DC | PRN

## 2018-12-25 MED ORDER — LACTATED RINGERS IV SOLN
INTRAVENOUS | Status: DC
  Administered 2018-12-25: 13:00:00 via INTRAVENOUS

## 2018-12-25 MED ORDER — OXYCODONE HCL 5 MG/5ML PO SOLN: 5.0000 mg | Freq: Once | ORAL | Status: DC | PRN

## 2018-12-25 MED ORDER — FENTANYL CITRATE (PF) 250 MCG/5ML IJ SOLN
INTRAMUSCULAR | Status: DC | PRN
  Administered 2018-12-25 (×3): 50 ug via INTRAVENOUS

## 2018-12-25 MED ORDER — ONDANSETRON HCL 4 MG/2ML IJ SOLN
INTRAMUSCULAR | Status: AC
  Filled 2018-12-25: qty 2

## 2018-12-25 MED ORDER — SUGAMMADEX SODIUM 200 MG/2ML IV SOLN
INTRAVENOUS | Status: DC | PRN
  Administered 2018-12-25: 280 mg via INTRAVENOUS

## 2018-12-25 MED ORDER — CHLORHEXIDINE GLUCONATE 4 % EX LIQD
60.0000 mL | Freq: Once | CUTANEOUS | Status: DC
  Filled 2018-12-25: qty 60

## 2018-12-25 MED ORDER — LIDOCAINE 2% (20 MG/ML) 5 ML SYRINGE
INTRAMUSCULAR | Status: AC
  Filled 2018-12-25: qty 5

## 2018-12-25 MED ORDER — DEXAMETHASONE SODIUM PHOSPHATE 10 MG/ML IJ SOLN
INTRAMUSCULAR | Status: AC
  Filled 2018-12-25: qty 1

## 2018-12-25 MED ORDER — TRANEXAMIC ACID-NACL 1000-0.7 MG/100ML-% IV SOLN
INTRAVENOUS | Status: AC
  Filled 2018-12-25: qty 100

## 2018-12-25 MED ORDER — ROCURONIUM BROMIDE 10 MG/ML (PF) SYRINGE
PREFILLED_SYRINGE | INTRAVENOUS | Status: DC | PRN
  Administered 2018-12-25: 50 mg via INTRAVENOUS

## 2018-12-25 MED ORDER — CEFAZOLIN SODIUM-DEXTROSE 2-3 GM-%(50ML) IV SOLR
INTRAVENOUS | Status: DC | PRN
  Administered 2018-12-25: 3 g via INTRAVENOUS

## 2018-12-25 MED ORDER — ROCURONIUM BROMIDE 10 MG/ML (PF) SYRINGE
PREFILLED_SYRINGE | INTRAVENOUS | Status: AC
  Filled 2018-12-25: qty 10

## 2018-12-25 MED ORDER — PROPOFOL 10 MG/ML IV BOLUS
INTRAVENOUS | Status: AC
  Filled 2018-12-25: qty 20

## 2018-12-25 MED ORDER — MIDAZOLAM HCL 2 MG/2ML IJ SOLN
INTRAMUSCULAR | Status: AC
  Filled 2018-12-25: qty 2

## 2018-12-25 MED ORDER — MUPIROCIN 2 % EX OINT: 1.0000 "application " | TOPICAL_OINTMENT | Freq: Two times a day (BID) | CUTANEOUS | Status: DC

## 2018-12-25 MED ORDER — SUGAMMADEX SODIUM 500 MG/5ML IV SOLN
INTRAVENOUS | Status: AC
  Filled 2018-12-25: qty 10

## 2018-12-25 MED ORDER — SODIUM CHLORIDE 0.9 % IR SOLN
Status: DC | PRN
  Administered 2018-12-25: 1000 mL

## 2018-12-25 MED ORDER — MIDAZOLAM HCL 5 MG/5ML IJ SOLN
INTRAMUSCULAR | Status: DC | PRN
  Administered 2018-12-25: 2 mg via INTRAVENOUS

## 2018-12-25 MED ORDER — FENTANYL CITRATE (PF) 250 MCG/5ML IJ SOLN
INTRAMUSCULAR | Status: AC
  Filled 2018-12-25: qty 5

## 2018-12-25 MED ORDER — DEXTROSE 5 % IV SOLN
3.0000 g | INTRAVENOUS | Status: DC
  Filled 2018-12-25 (×2): qty 3000

## 2018-12-25 MED ORDER — APIXABAN 5 MG PO TABS
5.0000 mg | ORAL_TABLET | Freq: Two times a day (BID) | ORAL | Status: DC
  Administered 2018-12-26 – 2018-12-27 (×3): 5 mg via ORAL
  Filled 2018-12-25 (×3): qty 1

## 2018-12-25 MED ORDER — LACTATED RINGERS IV SOLN: INTRAVENOUS | Status: DC

## 2018-12-25 MED ORDER — ONDANSETRON HCL 4 MG/2ML IJ SOLN: 4.0000 mg | Freq: Four times a day (QID) | INTRAMUSCULAR | Status: DC | PRN

## 2018-12-25 MED ORDER — LIDOCAINE 2% (20 MG/ML) 5 ML SYRINGE
INTRAMUSCULAR | Status: DC | PRN
  Administered 2018-12-25: 80 mg via INTRAVENOUS

## 2018-12-25 MED ORDER — FENTANYL CITRATE (PF) 100 MCG/2ML IJ SOLN: 25.0000 ug | INTRAMUSCULAR | Status: DC | PRN

## 2018-12-25 MED ORDER — CEFAZOLIN SODIUM-DEXTROSE 2-4 GM/100ML-% IV SOLN
2.0000 g | Freq: Three times a day (TID) | INTRAVENOUS | Status: AC
  Administered 2018-12-25 – 2018-12-26 (×2): 2 g via INTRAVENOUS
  Filled 2018-12-25 (×3): qty 100

## 2018-12-25 MED ORDER — PROPOFOL 10 MG/ML IV BOLUS
INTRAVENOUS | Status: DC | PRN
  Administered 2018-12-25: 150 mg via INTRAVENOUS

## 2018-12-25 MED ORDER — ONDANSETRON HCL 4 MG PO TABS: 4.0000 mg | ORAL_TABLET | Freq: Four times a day (QID) | ORAL | Status: DC | PRN

## 2018-12-25 MED ORDER — ACETAMINOPHEN 500 MG PO TABS
1000.0000 mg | ORAL_TABLET | Freq: Once | ORAL | Status: AC
  Administered 2018-12-25: 1000 mg via ORAL
  Filled 2018-12-25: qty 2

## 2018-12-25 SURGICAL SUPPLY — 60 items
BANDAGE ESMARK 6X9 LF (GAUZE/BANDAGES/DRESSINGS) IMPLANT
BLADE SURG 10 STRL SS (BLADE) ×3 IMPLANT
BNDG CMPR 9X4 STRL LF SNTH (GAUZE/BANDAGES/DRESSINGS)
BNDG CMPR 9X6 STRL LF SNTH (GAUZE/BANDAGES/DRESSINGS)
BNDG CMPR MED 15X6 ELC VLCR LF (GAUZE/BANDAGES/DRESSINGS) ×1
BNDG COHESIVE 4X5 TAN STRL (GAUZE/BANDAGES/DRESSINGS) ×3 IMPLANT
BNDG ELASTIC 4X5.8 VLCR STR LF (GAUZE/BANDAGES/DRESSINGS) ×3 IMPLANT
BNDG ELASTIC 6X15 VLCR STRL LF (GAUZE/BANDAGES/DRESSINGS) ×2 IMPLANT
BNDG ELASTIC 6X5.8 VLCR STR LF (GAUZE/BANDAGES/DRESSINGS) ×3 IMPLANT
BNDG ESMARK 4X9 LF (GAUZE/BANDAGES/DRESSINGS) IMPLANT
BNDG ESMARK 6X9 LF (GAUZE/BANDAGES/DRESSINGS)
BNDG GAUZE ELAST 4 BULKY (GAUZE/BANDAGES/DRESSINGS) ×5 IMPLANT
CONT SPEC 4OZ CLIKSEAL STRL BL (MISCELLANEOUS) IMPLANT
COVER SURGICAL LIGHT HANDLE (MISCELLANEOUS) ×3 IMPLANT
COVER WAND RF STERILE (DRAPES) ×3 IMPLANT
CUFF TOURN SGL LL 12 NO SLV (MISCELLANEOUS) IMPLANT
CUFF TOURN SGL QUICK 34 (TOURNIQUET CUFF)
CUFF TRNQT CYL 34X4.125X (TOURNIQUET CUFF) IMPLANT
DRAPE SURG 17X23 STRL (DRAPES) IMPLANT
DRAPE U-SHAPE 47X51 STRL (DRAPES) IMPLANT
DRSG PAD ABDOMINAL 8X10 ST (GAUZE/BANDAGES/DRESSINGS) ×3 IMPLANT
DURAPREP 26ML APPLICATOR (WOUND CARE) ×3 IMPLANT
ELECT REM PT RETURN 9FT ADLT (ELECTROSURGICAL)
ELECTRODE REM PT RTRN 9FT ADLT (ELECTROSURGICAL) IMPLANT
EVACUATOR 1/8 PVC DRAIN (DRAIN) IMPLANT
FACESHIELD WRAPAROUND (MASK) ×3 IMPLANT
FACESHIELD WRAPAROUND OR TEAM (MASK) ×1 IMPLANT
GAUZE SPONGE 4X4 12PLY STRL (GAUZE/BANDAGES/DRESSINGS) ×3 IMPLANT
GAUZE SPONGE 4X4 16PLY XRAY LF (GAUZE/BANDAGES/DRESSINGS) ×2 IMPLANT
GAUZE XEROFORM 1X8 LF (GAUZE/BANDAGES/DRESSINGS) ×3 IMPLANT
GLOVE BIO SURGEON STRL SZ7.5 (GLOVE) ×6 IMPLANT
GLOVE BIOGEL PI IND STRL 8 (GLOVE) ×2 IMPLANT
GLOVE BIOGEL PI INDICATOR 8 (GLOVE) ×4
GOWN STRL REUS W/ TWL LRG LVL3 (GOWN DISPOSABLE) ×3 IMPLANT
GOWN STRL REUS W/TWL LRG LVL3 (GOWN DISPOSABLE) ×9
HANDPIECE INTERPULSE COAX TIP (DISPOSABLE)
KIT BASIN OR (CUSTOM PROCEDURE TRAY) ×3 IMPLANT
KIT TURNOVER KIT B (KITS) ×3 IMPLANT
MANIFOLD NEPTUNE II (INSTRUMENTS) ×3 IMPLANT
NDL HYPO 25GX1X1/2 BEV (NEEDLE) IMPLANT
NEEDLE HYPO 25GX1X1/2 BEV (NEEDLE) IMPLANT
NS IRRIG 1000ML POUR BTL (IV SOLUTION) ×3 IMPLANT
PACK ORTHO EXTREMITY (CUSTOM PROCEDURE TRAY) ×3 IMPLANT
PAD ARMBOARD 7.5X6 YLW CONV (MISCELLANEOUS) ×6 IMPLANT
SET CYSTO W/LG BORE CLAMP LF (SET/KITS/TRAYS/PACK) IMPLANT
SET HNDPC FAN SPRY TIP SCT (DISPOSABLE) IMPLANT
SPONGE LAP 18X18 RF (DISPOSABLE) IMPLANT
STOCKINETTE IMPERVIOUS 9X36 MD (GAUZE/BANDAGES/DRESSINGS) ×3 IMPLANT
SUT ETHILON 3 0 PS 1 (SUTURE) IMPLANT
SUT MON AB 2-0 CT1 36 (SUTURE) ×2 IMPLANT
SUT PDS AB 2-0 CT1 27 (SUTURE) IMPLANT
SWAB CULTURE ESWAB REG 1ML (MISCELLANEOUS) IMPLANT
SYR CONTROL 10ML LL (SYRINGE) IMPLANT
TAPE STRIPS DRAPE STRL (GAUZE/BANDAGES/DRESSINGS) ×2 IMPLANT
TOWEL GREEN STERILE (TOWEL DISPOSABLE) ×3 IMPLANT
TOWEL GREEN STERILE FF (TOWEL DISPOSABLE) ×3 IMPLANT
TUBE CONNECTING 12'X1/4 (SUCTIONS) ×1
TUBE CONNECTING 12X1/4 (SUCTIONS) ×2 IMPLANT
UNDERPAD 30X30 (UNDERPADS AND DIAPERS) ×3 IMPLANT
YANKAUER SUCT BULB TIP NO VENT (SUCTIONS) ×3 IMPLANT

## 2018-12-25 NOTE — Transfer of Care (Signed)
Immediate Anesthesia Transfer of Care Note  Patient: [name]  Procedure(s) Performed: IRRIGATION AND DEBRIDEMENT (Left )  Patient Location: PACU  Anesthesia Type:General  Level of Consciousness: awake, alert  and oriented  Airway & Oxygen Therapy: Patient Spontanous Breathing and Patient connected to nasal cannula oxygen  Post-op Assessment: Report given to RN, Post -op Vital signs reviewed and stable and Patient moving all extremities  Post vital signs: Reviewed and stable  Last Vitals:  Vitals Value Taken Time  BP 113/57 12/25/18 1516  Temp 36.6 C 12/25/18 1515  Pulse 65 12/25/18 1518  Resp 18 12/25/18 1518  SpO2 100 % 12/25/18 1518  Vitals shown include unvalidated device data.  Last Pain:  Vitals:   12/25/18 1305  TempSrc:   PainSc: 0-No pain      Patients Stated Pain Goal: 0 (32/54/98 2641)  Complications: No apparent anesthesia complications

## 2018-12-25 NOTE — Progress Notes (Signed)
    Subjective: Patient underwent successful evacuation of her traumatic left lower leg hematoma today.  Objective:   VITALS:   Vitals:   12/24/18 0546 12/24/18 1623 12/24/18 1942 12/25/18 0543  BP: (!) 113/59 116/67 121/70 110/82  Pulse: 73 73 68 63  Resp: 18 17 19 20   Temp: (!) 97.5 F (36.4 C) 98.3 F (36.8 C) 98 F (36.7 C) 97.9 F (36.6 C)  TempSrc:      SpO2: 97% 100% 97% 99%  Weight:      Height:       CBC Latest Ref Rng & Units 12/17/2018 12/16/2018 12/10/2018  WBC 4.0 - 10.5 K/uL 7.1 7.3 7.6  Hemoglobin 12.0 - 15.0 g/dL 10.1(L) 10.3(L) 10.0(L)  Hematocrit 36.0 - 46.0 % 31.4(L) 30.6(L) 30.9(L)  Platelets 150 - 400 K/uL 408(H) 356 223   BMP Latest Ref Rng & Units 12/19/2018 12/17/2018 12/11/2018  Glucose 70 - 99 mg/dL 94 95 117(H)  BUN 6 - 20 mg/dL 9 9 7   Creatinine 0.44 - 1.00 mg/dL 0.93 0.87 0.73  Sodium 135 - 145 mmol/L 139 136 135  Potassium 3.5 - 5.1 mmol/L 4.1 4.1 4.0  Chloride 98 - 111 mmol/L 103 100 99  CO2 22 - 32 mmol/L 27 29 27   Calcium 8.9 - 10.3 mg/dL 8.5(L) 8.3(L) 8.0(L)   Intake/Output      10/03 0701 - 10/04 0700 10/04 0701 - 10/05 0700   P.O. 640 0   Total Intake(mL/kg) 640 (4.6) 0 (0)   Urine (mL/kg/hr)     Stool     Total Output     Net +640 0        Urine Occurrence 4 x 2 x   Stool Occurrence 1 x 2 x      Assessment / Plan: Day of Surgery  S/P Procedure(s) (LRB): IRRIGATION AND DEBRIDEMENT (Left) by Dr. Ernesta Amble. Percell Miller on 12/25/18  Principal Problem:   Trauma   Left lower leg hematoma, status post I/D and surgical evacuation Incentive Spirometry Elevate as much as possible Apply ice prn  Weightbearing: WBAT LLE Insicional and dressing care: Compressive Dressings left intact until follow-up Showering: Keep dressing dry VTE prophylaxis: Okay to resume Eliquis POD1 Pain control: Continue current regimen.  Minimize narcotics when able. Follow - up plan: 1 week to see Dr. Percell Miller in the office Contact information:  Edmonia Lynch MD, Roxan Hockey PA-C Please call with questions  Prudencio Burly III, PA-C 12/25/2018, 3:11 PM

## 2018-12-25 NOTE — Anesthesia Procedure Notes (Signed)
Procedure Name: Intubation Date/Time: 12/25/2018 2:34 PM Performed by: Amadeo Garnet, CRNA Pre-anesthesia Checklist: Patient identified, Emergency Drugs available, Suction available, Patient being monitored and Timeout performed Patient Re-evaluated:Patient Re-evaluated prior to induction Oxygen Delivery Method: Circle system utilized Preoxygenation: Pre-oxygenation with 100% oxygen Induction Type: IV induction Ventilation: Mask ventilation without difficulty Laryngoscope Size: Mac and 3 Grade View: Grade I Tube type: Oral Tube size: 7.0 mm Number of attempts: 1 Airway Equipment and Method: Stylet Placement Confirmation: ETT inserted through vocal cords under direct vision,  positive ETCO2 and breath sounds checked- equal and bilateral Secured at: 21 cm Tube secured with: Tape Dental Injury: Teeth and Oropharynx as per pre-operative assessment
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
## 2018-12-25 NOTE — Anesthesia Preprocedure Evaluation (Addendum)
Anesthesia Evaluation  Patient identified by MRN, date of birth, ID band Patient awake    Reviewed: Allergy & Precautions, NPO status , Patient's Chart, lab work & pertinent test results  History of Anesthesia Complications (+) PONV and history of anesthetic complications  Airway Mallampati: I  TM Distance: >3 FB Neck ROM: Full    Dental  (+) Dental Advisory Given, Teeth Intact   Pulmonary asthma , sleep apnea and Continuous Positive Airway Pressure Ventilation ,    Pulmonary exam normal        Cardiovascular hypertension, Pt. on home beta blockers and Pt. on medications (-) anginaNormal cardiovascular exam+ dysrhythmias Atrial Fibrillation      Neuro/Psych PSYCHIATRIC DISORDERS Depression  Neuromuscular disease (ulnar neuropathy)    GI/Hepatic Neg liver ROS, GERD  Controlled, S/p gastric bypass    Endo/Other  Hypothyroidism Morbid obesity  Renal/GU negative Renal ROS     Musculoskeletal negative musculoskeletal ROS (+)   Abdominal   Peds  Hematology  (+) anemia ,   Anesthesia Other Findings   Reproductive/Obstetrics  PCOS                            Anesthesia Physical Anesthesia Plan  ASA: III  Anesthesia Plan: General   Post-op Pain Management:    Induction: Intravenous  PONV Risk Score and Plan: 3 and Treatment may vary due to age or medical condition, Ondansetron, Dexamethasone and Midazolam  Airway Management Planned: Oral ETT  Additional Equipment: None  Intra-op Plan:   Post-operative Plan: Extubation in OR  Informed Consent: I have reviewed the patients History and Physical, chart, labs and discussed the procedure including the risks, benefits and alternatives for the proposed anesthesia with the patient or authorized representative who has indicated his/her understanding and acceptance.     Dental advisory given  Plan Discussed with: CRNA and  Anesthesiologist  Anesthesia Plan Comments:       Anesthesia Quick Evaluation

## 2018-12-25 NOTE — Anesthesia Postprocedure Evaluation (Signed)
Anesthesia Post Note  Patient: [name]  Procedure(s) Performed: IRRIGATION AND DEBRIDEMENT (Left )     Patient location during evaluation: PACU Anesthesia Type: General Level of consciousness: awake and alert Pain management: pain level controlled Vital Signs Assessment: post-procedure vital signs reviewed and stable Respiratory status: spontaneous breathing, nonlabored ventilation and respiratory function stable Cardiovascular status: blood pressure returned to baseline and stable Postop Assessment: no apparent nausea or vomiting Anesthetic complications: no    Last Vitals:  Vitals:   12/25/18 1530 12/25/18 1602  BP: 106/61 (!) 107/51  Pulse: 61 65  Resp: 19 18  Temp:  36.6 C  SpO2: 100% 97%    Last Pain:  Vitals:   12/25/18 1515  TempSrc:   PainSc: Vander

## 2018-12-25 NOTE — Interval H&P Note (Signed)
I participated in the care of this patient and agree with the above history, physical and evaluation. I performed a review of the history and a physical exam as detailed   Brandom Kerwin Daniel Fay Swider MD  
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
## 2018-12-26 ENCOUNTER — Inpatient Hospital Stay (HOSPITAL_COMMUNITY): Payer: Managed Care, Other (non HMO)

## 2018-12-26 ENCOUNTER — Inpatient Hospital Stay (HOSPITAL_COMMUNITY): Payer: Managed Care, Other (non HMO) | Admitting: Physical Therapy

## 2018-12-26 ENCOUNTER — Ambulatory Visit (HOSPITAL_COMMUNITY): Payer: Managed Care, Other (non HMO) | Admitting: Physical Therapy

## 2018-12-26 ENCOUNTER — Encounter (HOSPITAL_COMMUNITY): Payer: Self-pay | Admitting: Orthopedic Surgery

## 2018-12-26 ENCOUNTER — Encounter (HOSPITAL_COMMUNITY): Payer: Managed Care, Other (non HMO)

## 2018-12-26 DIAGNOSIS — T07XXXA Unspecified multiple injuries, initial encounter: Secondary | ICD-10-CM

## 2018-12-26 DIAGNOSIS — S2243XS Multiple fractures of ribs, bilateral, sequela: Secondary | ICD-10-CM

## 2018-12-26 DIAGNOSIS — G8918 Other acute postprocedural pain: Secondary | ICD-10-CM

## 2018-12-26 DIAGNOSIS — I480 Paroxysmal atrial fibrillation: Secondary | ICD-10-CM

## 2018-12-26 LAB — CBC
HCT: 33.5 % — ABNORMAL LOW (ref 36.0–46.0)
Hemoglobin: 11.2 g/dL — ABNORMAL LOW (ref 12.0–15.0)
MCH: 34.4 pg — ABNORMAL HIGH (ref 26.0–34.0)
MCHC: 33.4 g/dL (ref 30.0–36.0)
MCV: 102.8 fL — ABNORMAL HIGH (ref 80.0–100.0)
Platelets: 571 10*3/uL — ABNORMAL HIGH (ref 150–400)
RBC: 3.26 MIL/uL — ABNORMAL LOW (ref 3.87–5.11)
RDW: 13.7 % (ref 11.5–15.5)
WBC: 7.3 10*3/uL (ref 4.0–10.5)
nRBC: 0 % (ref 0.0–0.2)

## 2018-12-26 LAB — BASIC METABOLIC PANEL
Anion gap: 10 (ref 5–15)
BUN: 11 mg/dL (ref 6–20)
CO2: 22 mmol/L (ref 22–32)
Calcium: 8.6 mg/dL — ABNORMAL LOW (ref 8.9–10.3)
Chloride: 106 mmol/L (ref 98–111)
Creatinine, Ser: 0.98 mg/dL (ref 0.44–1.00)
GFR calc Af Amer: >60 mL/min (ref >60)
GFR calc non Af Amer: >60 mL/min (ref >60)
Glucose, Bld: 99 mg/dL (ref 70–99)
Potassium: 4.1 mmol/L (ref 3.5–5.1)
Sodium: 138 mmol/L (ref 135–145)

## 2018-12-26 MED ORDER — GABAPENTIN 300 MG PO CAPS
600.0000 mg | ORAL_CAPSULE | Freq: Three times a day (TID) | ORAL | Status: DC
  Administered 2018-12-26 – 2018-12-27 (×3): 600 mg via ORAL
  Filled 2018-12-26 (×3): qty 2

## 2018-12-26 NOTE — Progress Notes (Signed)
Physical Therapy Session Note  Patient Details  Name: [name] MRN: [id number] Date of Birth: 1965-06-21  Today's Date: 12/26/2018 PT Individual Time: [phone] and 1035-1105 and 1447-1531 PT Individual Time Calculation (min): 72 min and 30 min and 44 min  Short Term Goals: Week 2:  PT Short Term Goal 1 (Week 2): STG = LTG due to estimated d/c date.  Skilled Therapeutic Interventions/Progress Updates:  Treatment 1: Pt received in bed & agreeable to tx. Pt reports spasms in LUE & feels as the muscle is rolling during session with therapist adjusting sling, pt also c/o unrated burning in LLE during mobility with rest breaks provided & extremity elevated & ice applied at end of session. Notified nursing staff of pt's request for pain medication at end of session per pt request.  PA cleared pt for participation in session with recommendations of ACE wrap to LLE but this is already applied. Pt performed rolling L<>R with bed rails and cuing for bed to be <30 degrees until TLSO is donned. Pt rolls L<>R with supervision & bed rails. From bed level pt dons shirt with assistance to pull it down backside, assistance for threading shorts on BLE & cuing to roll to allow them to be pulled up over buttocks. Therapist donned TLSO from bed level total assist. Pt transitions from sidelying>sitting EOB with supervision & bed rails. Therapist assists with donning R ASO & B shoes for time management. Pt ambulates in room/bathroom with [facility] & supervision and completes toilet transfer with distant supervision; pt with continent void on toilet. Pt stands at sink to perform grooming tasks (brush hair & teeth) with distant supervision for standing balance. Pt ambulates room>gym>ortho gym>room with [facility] & supervision. Pt negotiates 4 steps x 2 trials with [facility] & supervision with seated rest break in between. Pt completes car transfer from SUV simulated height with supervision and using RUE to assist LLE into car 2/2  elevated floorboard on simulator car. At end of session pt left in recliner with BLE elevated & ice applied to LLE.     Treatment 2: Pt received in recliner & agreeable to tx. Pt without c/o pain during session but ice applied to LLE at beginning & end of session for pain management & BLE also elevated. Session focused on transfers from various surfaces & functional mobility. Pt completes sit<>stand from stable recliner in room, low, compliant couch & rocking recliner in apartment with supervision. Pt ambulates room<>apartment with SPC & mod I with ASO donned on RLE & ace wrap on LLE. At end of session pt left in recliner with all needs in reach.    Treatment 3: Pt received in room in handoff from OT with father & step-mom present for caregiver training. Pt with c/o L shoulder pain with therapist attempting to readjust sling & ice applied to L shoulder & LLE at end of session for pain relief (reminded pt of 20/20 minute ice rule). Pt ambulates around unit with SPC & mod I. Pt negotiates stairs with quad cane & supervision with pt & step mom return demonstrating task. Pt completes car transfer at [place] simulated height with supervision. Pt negotiates ramp & mulch with SPC & supervision, & curb step with SPC & CGA. Pt completes transfers from low, compliant couch & rocking recliner in apartment with mod I. Pt & family voice comfort with all education & pt's current mobility needs. Educated them on need to place necessary items on easily accessible shelves & use reacher  PRN to allow pt to maintain back precautions. Also educated them on pt's need to have them assist her prepare items the night before so she can have food/other items readily available when she is home alone the next day. At end of session pt left in recliner with BLE elevated, call bell & all needs in reach.    Therapy Documentation Precautions:  Precautions Precautions: Shoulder, Back, Fall Shoulder Interventions: Shoulder  sling/immobilizer Precaution Booklet Issued: No Precaution Comments: brace if HOB is >30 degrees, sling to L UE  Required Braces or Orthoses: Spinal Brace, Other Brace, Sling Spinal Brace: Thoracolumbosacral orthotic, Applied in supine position Other Brace: ASO R ankle, ace wrap LLE following evacuation of hematoma on 10/4 Restrictions Weight Bearing Restrictions: Yes LUE Weight Bearing: Non weight bearing RLE Weight Bearing: Weight bearing as tolerated LLE Weight Bearing: Weight bearing as tolerated Other Position/Activity Restrictions: with ASO brace   Balance: Balance Balance Assessed: Yes Standardized Balance Assessment Standardized Balance Assessment: Timed Up and Go Test Timed Up and Go Test TUG: Normal TUG Normal TUG (seconds): 18.44(average of 3 trials (20.20 seconds, 17.89 seconds, 17.23 seconds with SPC))    Therapy/Group: Individual Therapy  Sandi Mariscal 12/26/2018, 3:47 PM

## 2018-12-26 NOTE — Progress Notes (Signed)
Occupational Therapy Discharge Summary  Patient Details  Name: BRINDA FOCHT MRN: [id number] Date of Birth: 04-Jul-1965  Today's Date: 12/26/2018 OT Individual Time: 1400-1445 OT Individual Time Calculation (min): 45 min    Patient has met 9 of 9 long term goals due to improved activity tolerance, improved balance, postural control and ability to compensate for deficits.  Patient to discharge at overall Modified Independent level.  Patient's care partner is independent to provide the necessary physical assistance at discharge.    Reasons goals not met: All treatment goals met  Recommendation:  Patient will benefit from ongoing skilled OT services in home health setting to continue to advance functional skills in the area of BADL.  Equipment: Bari-bsc and hospital bed  Reasons for discharge: treatment goals met and discharge from hospital  Patient/family agrees with progress made and goals achieved: Yes   Skilled OT Intervention: Family education session completed with pt's parents. Pt transferred to supine in bed for demonstration and teach-back of donning/doffing TLSO and L UE sling. Working together, both were able to don/doff sling and brace with pt providing instruction. Discussed safety in the home and adherence to back precautions. Pt passed off to PT in room.   OT Discharge Precautions/Restrictions  Precautions Precautions: Shoulder;Back;Fall Shoulder Interventions: Shoulder sling/immobilizer Precaution Booklet Issued: No Precaution Comments: brace if HOB is >30 degrees, sling to L UE  Required Braces or Orthoses: Spinal Brace;Other Brace;Sling Spinal Brace: Thoracolumbosacral orthotic;Applied in supine position Other Brace: ASO R ankle, ace wrap LLE following evacuation of hematoma on 10/4 Restrictions Weight Bearing Restrictions: Yes LUE Weight Bearing: Non weight bearing RLE Weight Bearing: Weight bearing as tolerated LLE Weight Bearing: Weight bearing as  tolerated Other Position/Activity Restrictions: with ASO brace Vital Signs Therapy Vitals Temp: 98.1 F (36.7 C) Pulse Rate: 69 Resp: 20 BP: 134/76 Patient Position (if appropriate): Sitting Oxygen Therapy SpO2: 97 % Pain Pain Assessment Pain Scale: 0-10 Pain Score: 0-No pain Pain Type: Surgical pain Pain Location: Shoulder Pain Orientation: Left Pain Descriptors / Indicators: Aching;Discomfort Pain Frequency: Constant Pain Onset: On-going Patients Stated Pain Goal: 0 Pain Intervention(s): Medication (See eMAR) ADL ADL Eating: Independent Where Assessed-Eating: Chair Grooming: Independent Where Assessed-Grooming: Chair Upper Body Bathing: Minimal assistance Where Assessed-Upper Body Bathing: Bed level Lower Body Bathing: Minimal assistance Where Assessed-Lower Body Bathing: Chair Upper Body Dressing: Minimal assistance Where Assessed-Upper Body Dressing: Bed level Lower Body Dressing: Minimal assistance Where Assessed-Lower Body Dressing: Chair Toileting: Modified independent Where Assessed-Toileting: Risk analyst Method: Tax inspector: Close supervison Vision Baseline Vision/History: Wears glasses Wears Glasses: Reading only Patient Visual Report: No change from baseline Vision Assessment?: No apparent visual deficits Perception  Perception: Within Functional Limits Praxis Praxis: Intact Cognition Overall Cognitive Status: Within Functional Limits for tasks assessed Arousal/Alertness: Awake/alert Orientation Level: Oriented X4 Attention: Selective Selective Attention: Appears intact Memory: Appears intact Awareness: Appears intact Problem Solving: Appears intact Safety/Judgment: Appears intact Sensation Sensation Light Touch: (past hx of ulnar neuropathy) Proprioception: Appears Intact Coordination Gross Motor Movements are Fluid  and Coordinated: No Fine Motor Movements are Fluid and Coordinated: No Coordination and Movement Description: no AROM LUE d/t fx. Not fluid d/t back precautions Motor  Motor Motor: Abnormal postural alignment and control Motor - Discharge Observations: generalized deconditioning Mobility  Bed Mobility Bed Mobility: Rolling Right;Rolling Left;Supine to Sit;Sit to Supine Rolling Right: Supervision/verbal cueing Rolling Left: Supervision/Verbal cueing Supine to Sit: Supervision/Verbal cueing Sit to Supine: Supervision/Verbal cueing Transfers  Sit to Stand: Independent with assistive device Stand to Sit: Independent with assistive device  Trunk/Postural Assessment  Cervical Assessment Cervical Assessment: Exceptions to WFL(forward head) Thoracic Assessment Thoracic Assessment: Exceptions to WFL(s/p back sx) Lumbar Assessment Lumbar Assessment: Within Functional Limits Postural Control Postural Control: Deficits on evaluation Righting Reactions: delayed Protective Responses: delayed  Balance Balance Balance Assessed: Yes Standardized Balance Assessment Standardized Balance Assessment: Timed Up and Go Test Timed Up and Go Test TUG: Normal TUG Normal TUG (seconds): 18.44(average of 3 trials (20.20 seconds, 17.89 seconds, 17.23 seconds with SPC)) Static Sitting Balance Static Sitting - Balance Support: Feet supported Static Sitting - Level of Assistance: 6: Modified independent (Device/Increase time) Dynamic Sitting Balance Dynamic Sitting - Balance Support: Feet supported Dynamic Sitting - Level of Assistance: 6: Modified independent (Device/Increase time) Static Standing Balance Static Standing - Balance Support: During functional activity;Right upper extremity supported Static Standing - Level of Assistance: 6: Modified independent (Device/Increase time) Dynamic Standing Balance Dynamic Standing - Balance Support: During functional activity;Right upper extremity  supported Dynamic Standing - Level of Assistance: 6: Modified independent (Device/Increase time) Extremity/Trunk Assessment RUE Assessment RUE Assessment: Within Functional Limits LUE Assessment LUE Assessment: Exceptions to [facility] General Strength Comments: sling at all times   Curtis Sites 12/26/2018, 4:25 PM
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
## 2018-12-26 NOTE — Care Management (Signed)
[facility] Individual Statement of Services  Patient Name:  [name]  Date:  12/19/2018  Welcome to the [facility].  Our goal is to provide you with an individualized program based on your diagnosis and situation, designed to meet your specific needs.  With this comprehensive rehabilitation program, you will be expected to participate in at least 3 hours of rehabilitation therapies Monday-Friday, with modified therapy programming on the weekends.  Your rehabilitation program will include the following services:  Physical Therapy (PT), Occupational Therapy (OT), 24 hour per day rehabilitation nursing, Neuropsychology, Case Management (Social Worker), Rehabilitation Medicine, Nutrition Services and Pharmacy Services  Weekly team conferences will be held on Tuesdays to discuss your progress.  Your Social Worker will talk with you frequently to get your input and to update you on team discussions.  Team conferences with you and your family in attendance may also be held.  Expected length of stay: 10-12 days   Overall anticipated outcome: supervision/ independent  Depending on your progress and recovery, your program may change. Your Social Worker will coordinate services and will keep you informed of any changes. Your Social Worker's name and contact numbers are listed  below.  The following services may also be recommended but are not provided by the [facility] will be made to provide these services after discharge if needed.  Arrangements include referral to agencies that provide these services.  Your insurance has been verified to be:  [place] Your primary doctor is:  [facility]  Pertinent information will be shared with your doctor and your insurance company.  Social Worker:  [place], [place] or ([phone]   Information discussed with and copy given to patient by: Lennart Pall, 12/19/2018, 1:22 PM

## 2018-12-26 NOTE — Progress Notes (Signed)
  Patient ID: SAVANHA ISLAND, female   DOB: 1965-07-20, 53 y.o.   MRN: [id number]    Diagnosis code: S42.292D;  S22.49XA;  S22.059D  Height:  5'7"  Weight:  307 obs   Patient has  which requires her upper and lower body to be positioned in ways not feasible with a normal bed.  Head must be elevated at least 30 degrees due to bracing and mobility restrictions.   Upper body requires frequent and immediate changes in body position which cannot be achieved with a normal bed.   Reesa Chew, PA-C

## 2018-12-27 DIAGNOSIS — S42292S Other displaced fracture of upper end of left humerus, sequela: Secondary | ICD-10-CM

## 2018-12-27 DIAGNOSIS — S8012XA Contusion of left lower leg, initial encounter: Secondary | ICD-10-CM

## 2018-12-27 MED ORDER — GABAPENTIN 300 MG PO CAPS: 600.0000 mg | ORAL_CAPSULE | Freq: Three times a day (TID) | ORAL | 0 refills | Status: DC

## 2018-12-27 MED ORDER — CYCLOBENZAPRINE HCL 5 MG PO TABS: 5.0000 mg | ORAL_TABLET | Freq: Three times a day (TID) | ORAL | 0 refills | Status: DC | PRN

## 2018-12-27 MED ORDER — PANTOPRAZOLE SODIUM 40 MG PO TBEC: 40.0000 mg | DELAYED_RELEASE_TABLET | Freq: Every day | ORAL | 0 refills | Status: DC

## 2018-12-27 MED ORDER — TRAMADOL HCL 50 MG PO TABS: 100.0000 mg | ORAL_TABLET | Freq: Four times a day (QID) | ORAL | 0 refills | Status: DC

## 2018-12-27 MED ORDER — HYDROCORTISONE 1 % EX CREA: TOPICAL_CREAM | Freq: Three times a day (TID) | CUTANEOUS | 0 refills | Status: DC

## 2018-12-27 MED ORDER — CAMPHOR-MENTHOL 0.5-0.5 % EX LOTN: TOPICAL_LOTION | CUTANEOUS | 0 refills | Status: DC | PRN

## 2018-12-27 MED ORDER — METHOCARBAMOL 500 MG PO TABS: 1000.0000 mg | ORAL_TABLET | Freq: Four times a day (QID) | ORAL | 0 refills | Status: DC

## 2018-12-27 MED ORDER — OXYCODONE HCL 10 MG PO TABS: 5.0000 mg | ORAL_TABLET | Freq: Three times a day (TID) | ORAL | 0 refills | Status: DC | PRN

## 2018-12-27 NOTE — Progress Notes (Signed)
ORTHOPAEDIC PROGRESS NOTE  s/p Procedure(s): Open Reduction Internal Fixation Left proximal humerus fracture on 12/12/2018   SUBJECTIVE: She reports no difficulties with her left arm. Her pain has been minimal. She is overall very happy with her progress regarding her left arm. No chest pain. No SOB. No nausea/vomiting. No other complaints.  OBJECTIVE: PE: Incision CDI and sling well fitting,  full and painless ROM throughout hand with DPC of 0. + Motor in  AIN, PIN, Ulnar distributions. Axillary nerve sensation preserved and symmetric.  Sensation intact in medial, radial, and ulnar distributions. Well perfused digits.    Vitals:   12/26/18 1907 12/27/18 0516  BP: 114/68 126/75  Pulse: 67 60  Resp: 16 16  Temp: 98 F (36.7 C) 98 F (36.7 C)  SpO2: 95% 100%     ASSESSMENT: [name] is a 53 y.o. female doing well postoperatively.  PLAN: Weightbearing: WBAT LLE Physical Therapy: Patient can start pendulums and gentle passive ROM of left upper extremity with outpatient physical therapy when discharged.  Insicional and dressing care: Dressing has been removed from ORIF left humerus. Incision to be left open to air.  Orthopedic device(s): Sling Showering: Shower with assistance. Keep left lower extremity dressing dry.  VTE prophylaxis: Patient resumed Eliquis  Pain control: Continue current regimen. Minimize narcotics when able Follow - up plan: 4 weeks in office Contact information:  Weekdays 8-5 Noemi Chapel PA-C [phone], After hours and holidays please check [email or web address] for group call information for [facility]  We recommend the patient start gentle passive ROM and pendulums with outpatient physical therapy. She was instructed to remain in sling at all times except for showering/bathing and PT until she follows up with [place] in office in 4 weeks.

## 2018-12-27 NOTE — Progress Notes (Signed)
Social Work Discharge Note   The overall goal for the admission was met for:   Discharge location: Yes - home with father and step-mother  Length of Stay: Yes - 12 days  Discharge activity level: Yes - mod independent goals  Home/community participation: Yes  Services provided included: MD, RD, PT, OT, RN, TR, Pharmacy, [facility]: Private Insurance: [employer]  Follow-up services arranged: Home Health: PT, OT via [facility], DME: hospital bed, wide drop arm commode and SBQC via [facility] and Patient/Family has no preference for HH/DME agencies  Comments (or additional information):     Contact info:  Pt @ [phone]  Patient/Family verbalized understanding of follow-up arrangements: Yes  Individual responsible for coordination of the follow-up plan: pt  Confirmed correct DME delivered: Malayshia All 12/27/2018    Sascha Baugher
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
## 2018-12-29 ENCOUNTER — Telehealth: Payer: Self-pay

## 2018-12-29 NOTE — Telephone Encounter (Addendum)
  [place]  Patient Name: [name]. [place] DOB: 1965/08/24 Appointment Date and Time: 01-09-2019 / 220pm With: Dr. Dagoberto Ligas  Questions for our staff to ask patients on Transitional care 48 hour phone call:   1. Are you/is patient experiencing any problems since coming home? NO    Are there any questions regarding any aspect of care?  NO    2. Are there any questions regarding medications administration/dosing? NO     Are meds being taken as prescribed? YES   3. Have there been any falls? NO  4. Has Home Health been to the house and/or have they contacted you? YES     If not, have you tried to contact them? NA     Can we help you contact them? NA  5. Are bowels and bladder emptying properly? YES     Are there any unexpected incontinence issues? NA     If applicable, is patient following bowel/bladder programs? NA  6. Any fevers, problems with breathing, unexpected pain? NO 7. Are there any skin problems or new areas of breakdown? NO  8. Has the patient/family member arranged specialty MD follow up (ie cardiology/neurology/renal/surgical/etc)? YES      Can we help arrange? NA  9. Does the patient need any other services or support that we can help arrange? NO  10. Are caregivers following through as expected in assisting the patient? NO  11. Has the patient quit smoking, drinking alcohol, or using drugs as recommended? NA   [facility] [address] [phone]

## 2019-01-05 ENCOUNTER — Telehealth: Payer: Self-pay

## 2019-01-05 MED ORDER — TRAMADOL HCL 50 MG PO TABS: 100.0000 mg | ORAL_TABLET | Freq: Four times a day (QID) | ORAL | 0 refills | Status: DC | PRN

## 2019-01-05 NOTE — Telephone Encounter (Signed)
Patient called and requested a refill of tramadol medication.  Last refill according to PMP website was on: 12-27-2018 for 56 tablets.  Patients TC appointment is currently scheduled for  01-09-2019 at 2:20pm

## 2019-01-05 NOTE — Telephone Encounter (Signed)
I filled it- for 180 tabs.

## 2019-01-05 NOTE — Telephone Encounter (Signed)
Will refill since she's taking 6 tabs/day from the chart.  That's what was prescribed- I didn't see her in hospital except to cover, so I don't know her well. She was on Dr Charm Barges team.

## 2019-01-09 ENCOUNTER — Encounter: Payer: Self-pay | Admitting: Physical Medicine and Rehabilitation

## 2019-01-09 ENCOUNTER — Other Ambulatory Visit: Payer: Self-pay

## 2019-01-09 ENCOUNTER — Encounter
Payer: Managed Care, Other (non HMO) | Attending: Physical Medicine and Rehabilitation | Admitting: Physical Medicine and Rehabilitation

## 2019-01-09 VITALS — BP 115/71 | HR 84 | Temp 97.3°F | Ht 70.0 in | Wt 292.6 lb

## 2019-01-09 DIAGNOSIS — G8918 Other acute postprocedural pain: Secondary | ICD-10-CM | POA: Diagnosis present

## 2019-01-09 DIAGNOSIS — S42292S Other displaced fracture of upper end of left humerus, sequela: Secondary | ICD-10-CM | POA: Diagnosis not present

## 2019-01-09 DIAGNOSIS — S8012XS Contusion of left lower leg, sequela: Secondary | ICD-10-CM | POA: Insufficient documentation

## 2019-01-09 DIAGNOSIS — T07XXXA Unspecified multiple injuries, initial encounter: Secondary | ICD-10-CM | POA: Insufficient documentation

## 2019-01-09 DIAGNOSIS — S2243XS Multiple fractures of ribs, bilateral, sequela: Secondary | ICD-10-CM

## 2019-01-09 DIAGNOSIS — S22050S Wedge compression fracture of T5-T6 vertebra, sequela: Secondary | ICD-10-CM | POA: Diagnosis not present
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
## 2019-01-23 ENCOUNTER — Other Ambulatory Visit: Payer: Self-pay | Admitting: Physical Medicine and Rehabilitation

## 2019-01-27 ENCOUNTER — Telehealth: Payer: Self-pay

## 2019-01-27 NOTE — Telephone Encounter (Signed)
Prescription sheet faxed to [facility]

## 2019-01-27 NOTE — Telephone Encounter (Signed)
Wrote Rx for Vestibular PT with [facility] and will have it faxed for pt.

## 2019-01-27 NOTE — Telephone Encounter (Signed)
Patient called requesting a referral to the "[facility]" for continued or new vestibular therapy as her symptoms, she states, are becoming like that as it was in the hospital.

## 2019-02-13 ENCOUNTER — Encounter: Payer: Self-pay | Admitting: Physical Medicine and Rehabilitation

## 2019-06-18 ENCOUNTER — Other Ambulatory Visit: Payer: Self-pay | Admitting: Physical Medicine and Rehabilitation

## 2021-04-16 LAB — HM COLONOSCOPY

## 2021-05-06 ENCOUNTER — Other Ambulatory Visit: Payer: Self-pay

## 2021-05-06 ENCOUNTER — Other Ambulatory Visit (HOSPITAL_BASED_OUTPATIENT_CLINIC_OR_DEPARTMENT_OTHER): Payer: Self-pay

## 2021-05-06 ENCOUNTER — Ambulatory Visit (HOSPITAL_BASED_OUTPATIENT_CLINIC_OR_DEPARTMENT_OTHER)
Admission: RE | Admit: 2021-05-06 | Discharge: 2021-05-06 | Disposition: A | Discharge status: Home / Self Care | Payer: Managed Care, Other (non HMO) | Source: Ambulatory Visit | Attending: Diagnostic Radiology | Admitting: Diagnostic Radiology

## 2021-05-06 DIAGNOSIS — R509 Fever, unspecified: Secondary | ICD-10-CM | POA: Diagnosis present

## 2021-05-06 DIAGNOSIS — R000 Tachycardia, unspecified: Secondary | ICD-10-CM | POA: Diagnosis present

## 2021-05-06 DIAGNOSIS — R0602 Shortness of breath: Secondary | ICD-10-CM | POA: Diagnosis present
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
## 2021-07-07 DIAGNOSIS — Z20822 Contact with and (suspected) exposure to covid-19: Secondary | ICD-10-CM | POA: Diagnosis not present

## 2022-01-01 ENCOUNTER — Telehealth: Payer: Self-pay | Admitting: General Practice

## 2022-01-01 NOTE — Telephone Encounter (Signed)
Pt states: -She would like to be added to Dr. Garret Reddish, MD's primary care panel. -Dr. Yong Channel verbally granted permission to establish.   Please advise

## 2022-01-02 NOTE — Telephone Encounter (Signed)
See below

## 2022-01-02 NOTE — Telephone Encounter (Signed)
Yes thanks I did agree

## 2022-01-19 ENCOUNTER — Encounter: Payer: Self-pay | Admitting: Family Medicine

## 2022-01-19 ENCOUNTER — Ambulatory Visit (INDEPENDENT_AMBULATORY_CARE_PROVIDER_SITE_OTHER): Payer: BC Managed Care – PPO | Admitting: Family Medicine

## 2022-01-19 VITALS — BP 112/74 | HR 60 | Temp 97.9°F | Ht 70.0 in | Wt 272.6 lb

## 2022-01-19 DIAGNOSIS — Z0000 Encounter for general adult medical examination without abnormal findings: Secondary | ICD-10-CM

## 2022-01-19 DIAGNOSIS — N809 Endometriosis, unspecified: Secondary | ICD-10-CM

## 2022-01-19 DIAGNOSIS — I10 Essential (primary) hypertension: Secondary | ICD-10-CM

## 2022-01-19 DIAGNOSIS — I7300 Raynaud's syndrome without gangrene: Secondary | ICD-10-CM

## 2022-01-19 DIAGNOSIS — G4733 Obstructive sleep apnea (adult) (pediatric): Secondary | ICD-10-CM | POA: Diagnosis not present

## 2022-01-19 DIAGNOSIS — Z1283 Encounter for screening for malignant neoplasm of skin: Secondary | ICD-10-CM

## 2022-01-19 DIAGNOSIS — S2243XS Multiple fractures of ribs, bilateral, sequela: Secondary | ICD-10-CM

## 2022-01-19 DIAGNOSIS — Z1159 Encounter for screening for other viral diseases: Secondary | ICD-10-CM

## 2022-01-19 DIAGNOSIS — E039 Hypothyroidism, unspecified: Secondary | ICD-10-CM

## 2022-01-19 DIAGNOSIS — E538 Deficiency of other specified B group vitamins: Secondary | ICD-10-CM

## 2022-01-19 DIAGNOSIS — I480 Paroxysmal atrial fibrillation: Secondary | ICD-10-CM

## 2022-01-19 MED ORDER — EPINEPHRINE 0.3 MG/0.3ML IJ SOAJ: 0.3000 mg | INTRAMUSCULAR | 1 refills | Status: DC | PRN

## 2022-01-19 NOTE — Patient Instructions (Addendum)
Sign release of information at the check out desk for COLONOSCOPY.  We will call you within two weeks about your referral to dermatology. If you do not hear within 2 weeks, give [place] a call.   Schedule a lab visit at the check out desk within 2 weeks. Return for future fasting labs meaning nothing but water after midnight please. Ok to take your medications with water.   Refilled epi pen  Let us know when you get final shingrix  Recommended follow up: Return in about 6 months (around 07/21/2022) for followup or sooner if needed.Schedule b4 you leave.

## 2022-01-19 NOTE — Progress Notes (Signed)
Phone [phone]   Subjective:  Patient presents today for their annual physical and to establish care was with Tabitha Cousart Hutchens DNP with [facility] in [place] (now with [facility])- office now closed but has alternate records. Chief complaint-noted.   See problem oriented charting- ROS- full  review of systems was completed and negative except for: fatigue, numbness in left shoulder and 5th finger, dry eyes, hoarseness, palpitations, swollen legs, headaches, heartburn, diarrhea  The following were reviewed and entered/updated in epic: Past Medical History:  Diagnosis Date   Asthma    Atrial fibrillation ([place])    Avulsion fracture of ankle 11/2018   right talar   Carpal tunnel syndrome of right wrist    Chest pain    negative cardiac cath   Complication of anesthesia    DEPRESSION 10/28/2009   GERD 05/11/2007   HYPERTENSION 06/18/2008   Major depressive disorder    OSA (obstructive sleep apnea)    with hypersomnia   Palpitation    Plantar fasciitis    PONV (postoperative nausea and vomiting)    Nausea sometimes   Ulnar nerve neuropathy    Patient Active Problem List   Diagnosis Date Noted   PAF (paroxysmal atrial fibrillation) ([place])     Priority: High   Hypothyroidism 01/19/2022    Priority: Medium    B12 deficiency 01/19/2022    Priority: Medium    DEPRESSION 10/28/2009    Priority: Medium    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   Hematoma of left lower extremity 12/27/2018    Priority: 1.   Multiple fractures of ribs, bilateral,  sequela     Priority: 1.   PLANTAR FASCIITIS 08/13/2008    Priority: 1.   Past Surgical History:  Procedure Laterality Date   ABDOMINAL HYSTERECTOMY  2016   LAVH with BSO   CESAREAN SECTION  07/28/2012   CHOLECYSTECTOMY     1995- hospitalized   FRACTURE SURGERY  12/12/2018   Orif left shoulder   I & D EXTREMITY Left 12/25/2018   Hematoma Procedure: IRRIGATION AND DEBRIDEMENT;  Surgeon: Renette Butters, MD;  Location: [place];  Service: Orthopedics;  Laterality: Left;   LAPAROSCOPIC GASTRIC BYPASS  2005   ORIF HUMERUS FRACTURE Left 12/12/2018   Procedure: OPEN REDUCTION INTERNAL FIXATION (ORIF) LEFT PROXIMAL HUMERUS FRACTURE;  Surgeon: Hiram Gash, MD;  Location: [place];  Service: Orthopedics;  Laterality: Left;    Family History  Problem Relation Age of Onset   Heart disease Mother        over 18 cabg   Aneurysm Mother        thoracic ascending- repair when had cabg   Arthritis Mother    COPD Mother        non smoker   Diabetes Mother    Hyperlipidemia Mother    Hypertension Mother    Kidney disease Mother    Miscarriages / Stillbirths Mother    Obesity Mother    Stroke  Mother        over age 42   Varicose Veins Mother    Dementia Mother        died age 62. mixed with vascular and alzheiemers   Hemochromatosis Mother        homozygous   Arthritis Father    Skin cancer Father        basal and squamous and melanoma in situ   Vision loss Father        after shingles   Alcohol abuse Brother    Arthritis Brother    Asthma Brother    Skin cancer Brother        squamous   Depression Brother    Drug abuse Brother    Hypertension Brother    Obesity Brother    COPD Maternal Grandmother    Heart disease Maternal Grandmother    Heart disease Maternal Grandfather    Stroke Maternal Grandfather    Arthritis Paternal Grandmother    Obesity Paternal Grandmother    Vision loss Paternal Grandmother    Varicose Veins Paternal Grandmother    Cancer Paternal Grandfather         MDS   Obesity Paternal Grandfather    Anxiety disorder Daughter    Asthma Daughter    Hearing loss Daughter        tubes when younger- later infection 25% loss membrane and needed surgery- choleastoma and then another surgery   Cancer Maternal Aunt        CML   Depression Maternal Aunt    Cancer Maternal Uncle        non hodgkins lymphoma   Heart disease Maternal Uncle    Breast cancer Paternal Aunt    Melanoma Paternal Aunt        with mest   Breast cancer Paternal Aunt     Medications- reviewed and updated Current Outpatient Medications  Medication Sig Dispense Refill   albuterol (PROAIR HFA) 108 (90 BASE) MCG/ACT inhaler Inhale 2 puffs into the lungs every 6 (six) hours as needed for wheezing or shortness of breath.      apixaban (ELIQUIS) 5 MG TABS tablet Take 5 mg by mouth 2 (two) times daily.     eletriptan (RELPAX) 20 MG tablet Take 20 mg by mouth every 2 (two) hours as needed for migraine.      EPINEPHrine 0.3 mg/0.3 mL IJ SOAJ injection Inject 0.3 mg into the muscle as needed for anaphylaxis. 2 each 1   estradiol (VIVELLE-DOT) 0.1 MG/24HR Place 1 patch onto the skin 2 (two) times a week.       FLUoxetine (PROZAC) 40 MG capsule Take 40 mg by mouth daily.       levothyroxine (SYNTHROID) 50 MCG tablet Take 50 mcg by mouth daily before breakfast.     metoprolol succinate (TOPROL-XL) 25 MG 24 hr tablet Take 25 mg by mouth 2 (two) times daily.     potassium chloride SA (K-DUR) 20 MEQ tablet Take 20 mEq by mouth daily as needed (when taking lasix).     traZODone (DESYREL) 50 MG tablet Take 50 mg by mouth at bedtime as needed for sleep.     vitamin B-12 1000 MCG tablet Take 1 tablet (1,000 mcg total) by mouth daily.     cholecalciferol (VITAMIN D) 25 MCG tablet Take 1 tablet (1,000 Units total) by mouth daily.     furosemide (LASIX) 20 MG tablet Take 20 mg by mouth daily as needed for edema. (Patient not  taking: Reported on 01/19/2022)     ondansetron (ZOFRAN-ODT) 8 MG  disintegrating tablet Take 8 mg by mouth every 8 (eight) hours as needed for nausea or vomiting. (Patient not taking: Reported on 01/19/2022)     polyethylene glycol (MIRALAX / GLYCOLAX) 17 g packet Take 17 g by mouth 2 (two) times daily. 14 each 0   No current facility-administered medications for this visit.    Allergies-reviewed and updated Allergies  Allergen Reactions   Bee Venom Swelling    Localized swelling   Latex Shortness Of Breath and Rash   Adhesive [Tape]     Contact dermatitis    Amoxicillin-Pot Clavulanate Diarrhea    Did it involve swelling of the face/tongue/throat, SOB, or low BP? No Did it involve sudden or severe rash/hives, skin peeling, or any reaction on the inside of your mouth or nose? No Did you need to seek medical attention at a hospital or doctor's office? No When did it last happen?      10 + years If all above answers are "NO", may proceed with cephalosporin use.    Erythromycin Diarrhea    Social History   Social History Narrative   Lives with daughter 88 in 2023- new home 2023. Puppy- part fox hound part beagle.       Works as NP with hospice/palliative care      Hobbies: enjoys traveling- likes the beach- has been to [place] in the past after her mom passed, horseback riding, photogrpahy   Objective  Objective:  BP 112/74   Pulse 60   Temp 97.9 F (36.6 C)   Ht 5\' 10"  (1.778 m)   Wt 272 lb 9.6 oz (123.7 kg)   SpO2 95%   BMI 39.11 kg/m  Gen: NAD, resting comfortably HEENT: Mucous membranes are moist. Oropharynx normal Neck: no thyromegaly CV: RRR no murmurs rubs or gallops Lungs: CTAB no crackles, wheeze, rhonchi Abdomen: soft/nontender/nondistended/normal bowel sounds. No rebound or guarding.  Ext: 1+ bilateral edema Skin: warm, dry Neuro: grossly normal, moves all extremities, PERRLA   Assessment and Plan   56 y.o. female presenting for annual physical.  Health Maintenance counseling: 1. Anticipatory guidance: Patient  counseled regarding regular dental exams -q6 months, eye exams - yearly or every few years- no significant vision changes,  avoiding smoking and second hand smoke , limiting alcohol to 1 beverage per day - sparing drink, no illicit drugs .   2. Risk factor reduction:  Advised patient of need for regular exercise and diet rich and fruits and vegetables to reduce risk of heart attack and stroke.  Exercise- would like to increase- plans to walk with daughter and dad and step mother.  Diet/weight management-down 20 lbs since 2020- unfortunately sick and stress related plus GI issues- has bypass.  Wt Readings from Last 3 Encounters:  01/19/22 272 lb 9.6 oz (123.7 kg)  01/09/19 292 lb 9.6 oz (132.7 kg)  12/21/18 (!) 304 lb 10.8 oz (138.2 kg)  3. Immunizations/screenings/ancillary studies- will let 12/23/18 know when gets final shingrix Immunization History  Administered Date(s) Administered   Fluad Quad(high Dose 65+) 12/18/2018   Influenza Whole 01/02/2010   Influenza,inj,Quad PF,6+ Mos 01/04/2022   PFIZER Comirnaty(Gray Top)Covid-19 Tri-Sucrose Vaccine 12/30/2021   PFIZER(Purple Top)SARS-COV-2 Vaccination 03/23/2019, 04/18/2019   Pneumococcal Polysaccharide-23 01/09/2000, 12/22/2018   Td 09/21/2007   Tdap 12/08/2018   Zoster Recombinat (Shingrix) 10/30/2021  4. Cervical cancer screening- still gets papa per Dr. 12/30/2021- scheduled 01/27/22 5. Breast cancer screening-  breast exam -  with Dr. Henderson Cloudomblin and mammogram - scheduled next week 01/27/22 6. Colon cancer screening - Dr. Ewing SchleinMagod feb/march 2023- will get records 7. Skin cancer screening- refer to dermatology today.  8. Birth control/STD check- not sexually active 9. Osteoporosis screening at 65- scheduled 01/27/22 10. Smoking associated screening - never smoker  Status of chronic or acute concerns   #Health care team - Sees Dr. Henderson Cloudomblin - Sees Dr. Willeen CassWahid -Dr. Briscoe DeutscherNottage dentist -[facility]   #other info- factor V leiden gene,  + ANA, has 1 copy of hereditary hemochromatosis   # Atrial fibrillation-follows with Dr. Willeen CassWahid with [facility] S: Rate controlled with metoprolol 25 mg extended release twice daily Anticoagulated with Eliquis 5 mg twice daily A/P: Appropriately anticoagulated and rate controlled-continue current medication   #hypertension/edema S: medication: lisinopril 10 mg, metoprolol 25 mg XR,  furosemide 20 mg but only as needed for edema very sparing A/P:  Controlled. Continue current medications.     #hypothyroidism S: compliant On thyroid medication-levothyroxine 50 mcg A/P: hopefully stable- update TSH with labs. Continue current meds for now    #Migraine S:: Relpax 20 mg as needed-reports works well A/P: Okay for refill-we will need to make sure that follow-up less than twice a week to prevent rebound  # Depression/anxiety S: Medication: fluoxetine 40 mg through PCP- had been as high as 60 and tolerated well , trazodone for sleep -alprazolam in past with times of family illness- particularly loss of mom aug 2021- did grief counseling but still struggled A/P: Reports reasonably well-controlled-continue current medication  # B12 deficiency S: Current treatment/medication (oral vs. IM): 1000 mcg daily A/P:  hopefully stable- update labs. Continue current meds for now    #latex allergy- rash and shortness of breath- keeps epi pen on hand-refilled today  #asthma- mild asthma with colds/illness- prn albuterol  # hormone replacement therapy -estradiol patch 0.1 mg twice a week through GYN    #OSA-compliant with CPAP   #IBS/GI issues- uses miralax prn  #Spot on abdomen-slightly darker on lower abdomen-will refer to dermatology-do not strongly suspect melanoma but is darker than most of her other lesions  Recommended follow up: Return in about 6 months (around 07/21/2022) for followup or sooner if needed.Schedule b4 you leave.  Lab/Order associations: will come back  fasting    ICD-10-CM   1. Preventative health care  Z00.00 Hepatitis C antibody    Ambulatory referral to Dermatology    Comprehensive metabolic panel    CBC with Differential/Platelet    Lipid panel    2. Screening exam for skin cancer  Z12.83 Ambulatory referral to Dermatology    3. OSA on CPAP  G47.33     4. Raynaud's disease without gangrene  I73.00     5. Essential hypertension  I10 Comprehensive metabolic panel    CBC with Differential/Platelet    Lipid panel    6. PAF (paroxysmal atrial fibrillation) (HCC)  I48.0     7. Endometriosis  N80.9     8. Hypothyroidism, unspecified type  E03.9 TSH    9. B12 deficiency  E53.8 Vitamin B12    10. Multiple fractures of ribs, bilateral, sequela  S22.43XS     11. Encounter for hepatitis C screening test for low risk patient  Z11.59 Hepatitis C antibody      Meds ordered this encounter  Medications   EPINEPHrine 0.3 mg/0.3 mL IJ SOAJ injection    Sig: Inject 0.3 mg into the muscle as needed for anaphylaxis.  Dispense:  2 each    Refill:  1   Return precautions advised.  Tana Conch, MD

## 2022-01-22 ENCOUNTER — Other Ambulatory Visit (INDEPENDENT_AMBULATORY_CARE_PROVIDER_SITE_OTHER): Payer: BC Managed Care – PPO

## 2022-01-22 ENCOUNTER — Encounter: Payer: Self-pay | Admitting: Family Medicine

## 2022-01-22 DIAGNOSIS — Z1159 Encounter for screening for other viral diseases: Secondary | ICD-10-CM | POA: Diagnosis not present

## 2022-01-22 DIAGNOSIS — Z0000 Encounter for general adult medical examination without abnormal findings: Secondary | ICD-10-CM

## 2022-01-22 DIAGNOSIS — E039 Hypothyroidism, unspecified: Secondary | ICD-10-CM | POA: Diagnosis not present

## 2022-01-22 DIAGNOSIS — I10 Essential (primary) hypertension: Secondary | ICD-10-CM

## 2022-01-22 DIAGNOSIS — E538 Deficiency of other specified B group vitamins: Secondary | ICD-10-CM

## 2022-01-22 LAB — CBC WITH DIFFERENTIAL/PLATELET
Basophils Absolute: 0 10*3/uL (ref 0.0–0.1)
Basophils Relative: 0.9 % (ref 0.0–3.0)
Eosinophils Absolute: 0.2 10*3/uL (ref 0.0–0.7)
Eosinophils Relative: 6.1 % — ABNORMAL HIGH (ref 0.0–5.0)
HCT: 35.2 % — ABNORMAL LOW (ref 36.0–46.0)
Hemoglobin: 11.9 g/dL — ABNORMAL LOW (ref 12.0–15.0)
Lymphocytes Relative: 35.1 % (ref 12.0–46.0)
Lymphs Abs: 1.4 10*3/uL (ref 0.7–4.0)
MCHC: 33.7 g/dL (ref 30.0–36.0)
MCV: 97.7 fl (ref 78.0–100.0)
Monocytes Absolute: 0.4 10*3/uL (ref 0.1–1.0)
Monocytes Relative: 9.6 % (ref 3.0–12.0)
Neutro Abs: 1.9 10*3/uL (ref 1.4–7.7)
Neutrophils Relative %: 48.3 % (ref 43.0–77.0)
Platelets: 202 10*3/uL (ref 150.0–400.0)
RBC: 3.61 Mil/uL — ABNORMAL LOW (ref 3.87–5.11)
RDW: 13.1 % (ref 11.5–15.5)
WBC: 3.9 10*3/uL — ABNORMAL LOW (ref 4.0–10.5)

## 2022-01-22 LAB — COMPREHENSIVE METABOLIC PANEL
ALT: 29 U/L (ref 0–35)
AST: 34 U/L (ref 0–37)
Albumin: 3.7 g/dL (ref 3.5–5.2)
Alkaline Phosphatase: 104 U/L (ref 39–117)
BUN: 10 mg/dL (ref 6–23)
CO2: 29 mEq/L (ref 19–32)
Calcium: 8.6 mg/dL (ref 8.4–10.5)
Chloride: 106 mEq/L (ref 96–112)
Creatinine, Ser: 0.86 mg/dL (ref 0.40–1.20)
GFR: 75.43 mL/min (ref >60.00)
Glucose, Bld: 87 mg/dL (ref 70–99)
Potassium: 3.7 mEq/L (ref 3.5–5.1)
Sodium: 141 mEq/L (ref 135–145)
Total Bilirubin: 0.6 mg/dL (ref 0.2–1.2)
Total Protein: 6.4 g/dL (ref 6.0–8.3)

## 2022-01-22 LAB — LIPID PANEL
Cholesterol: 102 mg/dL (ref 0–200)
HDL: 37.3 mg/dL — ABNORMAL LOW (ref >39.00)
LDL Cholesterol: 56 mg/dL (ref 0–99)
NonHDL: 64.45
Total CHOL/HDL Ratio: 3
Triglycerides: 41 mg/dL (ref 0.0–149.0)
VLDL: 8.2 mg/dL (ref 0.0–40.0)

## 2022-01-22 LAB — TSH: TSH: 4.01 u[IU]/mL (ref 0.35–5.50)

## 2022-01-22 LAB — VITAMIN B12: Vitamin B-12: 485 pg/mL (ref 211–911)

## 2022-01-23 LAB — HEPATITIS C ANTIBODY: Hepatitis C Ab: NONREACTIVE

## 2022-01-27 DIAGNOSIS — Z01419 Encounter for gynecological examination (general) (routine) without abnormal findings: Secondary | ICD-10-CM | POA: Diagnosis not present

## 2022-01-27 DIAGNOSIS — Z6839 Body mass index (BMI) 39.0-39.9, adult: Secondary | ICD-10-CM | POA: Diagnosis not present

## 2022-01-27 DIAGNOSIS — Z13820 Encounter for screening for osteoporosis: Secondary | ICD-10-CM | POA: Diagnosis not present

## 2022-01-27 DIAGNOSIS — Z1231 Encounter for screening mammogram for malignant neoplasm of breast: Secondary | ICD-10-CM | POA: Diagnosis not present

## 2022-01-27 LAB — HM MAMMOGRAPHY

## 2022-01-27 LAB — HM PAP SMEAR

## 2022-02-02 ENCOUNTER — Other Ambulatory Visit: Payer: Self-pay | Admitting: Family Medicine

## 2022-02-02 MED ORDER — TRAZODONE HCL 50 MG PO TABS: 50.0000 mg | ORAL_TABLET | Freq: Every evening | ORAL | 3 refills | Status: DC | PRN

## 2022-02-02 MED ORDER — ELETRIPTAN HYDROBROMIDE 20 MG PO TABS: 20.0000 mg | ORAL_TABLET | ORAL | 11 refills | Status: DC | PRN

## 2022-02-02 NOTE — Telephone Encounter (Addendum)
Last refill done by historical provider  Last visit 12/23/2021

## 2022-02-03 ENCOUNTER — Encounter: Payer: Self-pay | Admitting: Family Medicine

## 2022-02-04 NOTE — Telephone Encounter (Signed)
Mychart message has been sent to pt for her to reach out to her insurance to see what they will cover.

## 2022-02-10 ENCOUNTER — Other Ambulatory Visit: Payer: Self-pay

## 2022-02-10 MED ORDER — FLUOXETINE HCL 40 MG PO CAPS: 40.0000 mg | ORAL_CAPSULE | Freq: Every day | ORAL | 3 refills | Status: DC

## 2022-02-10 MED ORDER — METOPROLOL SUCCINATE ER 50 MG PO TB24: 25.0000 mg | ORAL_TABLET | Freq: Two times a day (BID) | ORAL | 3 refills | Status: DC

## 2022-02-10 MED ORDER — LISINOPRIL 10 MG PO TABS: 10.0000 mg | ORAL_TABLET | Freq: Every day | ORAL | 3 refills | Status: DC

## 2022-02-10 MED ORDER — LEVOTHYROXINE SODIUM 50 MCG PO TABS: 50.0000 ug | ORAL_TABLET | Freq: Every day | ORAL | 3 refills | Status: DC

## 2022-02-19 ENCOUNTER — Encounter: Payer: Self-pay | Admitting: Family Medicine

## 2022-02-23 DIAGNOSIS — I38 Endocarditis, valve unspecified: Secondary | ICD-10-CM | POA: Diagnosis not present

## 2022-02-23 DIAGNOSIS — I10 Essential (primary) hypertension: Secondary | ICD-10-CM | POA: Diagnosis not present

## 2022-02-23 DIAGNOSIS — I480 Paroxysmal atrial fibrillation: Secondary | ICD-10-CM | POA: Diagnosis not present

## 2022-02-23 DIAGNOSIS — E785 Hyperlipidemia, unspecified: Secondary | ICD-10-CM | POA: Diagnosis not present

## 2022-02-23 DIAGNOSIS — E669 Obesity, unspecified: Secondary | ICD-10-CM | POA: Diagnosis not present

## 2022-02-26 DIAGNOSIS — L814 Other melanin hyperpigmentation: Secondary | ICD-10-CM | POA: Diagnosis not present

## 2022-02-26 DIAGNOSIS — D485 Neoplasm of uncertain behavior of skin: Secondary | ICD-10-CM | POA: Diagnosis not present

## 2022-02-26 DIAGNOSIS — D225 Melanocytic nevi of trunk: Secondary | ICD-10-CM | POA: Diagnosis not present

## 2022-02-26 DIAGNOSIS — L821 Other seborrheic keratosis: Secondary | ICD-10-CM | POA: Diagnosis not present

## 2022-02-26 DIAGNOSIS — L578 Other skin changes due to chronic exposure to nonionizing radiation: Secondary | ICD-10-CM | POA: Diagnosis not present

## 2022-02-26 DIAGNOSIS — D1801 Hemangioma of skin and subcutaneous tissue: Secondary | ICD-10-CM | POA: Diagnosis not present

## 2022-03-02 ENCOUNTER — Other Ambulatory Visit: Payer: Self-pay

## 2022-03-02 MED ORDER — APIXABAN 5 MG PO TABS: 5.0000 mg | ORAL_TABLET | Freq: Two times a day (BID) | ORAL | 3 refills | Status: DC

## 2022-03-02 NOTE — Telephone Encounter (Signed)
Zofran not on current med list, ok to send in? Other questions have been addressed.

## 2022-03-03 ENCOUNTER — Other Ambulatory Visit: Payer: Self-pay

## 2022-03-03 MED ORDER — ONDANSETRON HCL 8 MG PO TABS: 8.0000 mg | ORAL_TABLET | Freq: Three times a day (TID) | ORAL | 3 refills | Status: DC | PRN

## 2022-03-12 ENCOUNTER — Ambulatory Visit: Payer: BC Managed Care – PPO | Admitting: Internal Medicine

## 2022-03-23 ENCOUNTER — Emergency Department (HOSPITAL_BASED_OUTPATIENT_CLINIC_OR_DEPARTMENT_OTHER)
Admission: EM | Admit: 2022-03-23 | Discharge: 2022-03-23 | Disposition: A | Discharge status: Home / Self Care | Payer: BC Managed Care – PPO | Attending: Emergency Medicine | Admitting: Emergency Medicine

## 2022-03-23 ENCOUNTER — Other Ambulatory Visit: Payer: Self-pay

## 2022-03-23 ENCOUNTER — Encounter (HOSPITAL_BASED_OUTPATIENT_CLINIC_OR_DEPARTMENT_OTHER): Payer: Self-pay | Admitting: *Deleted

## 2022-03-23 DIAGNOSIS — Z91040 Latex allergy status: Secondary | ICD-10-CM | POA: Insufficient documentation

## 2022-03-23 DIAGNOSIS — W540XXA Bitten by dog, initial encounter: Secondary | ICD-10-CM | POA: Diagnosis not present

## 2022-03-23 DIAGNOSIS — Y9289 Other specified places as the place of occurrence of the external cause: Secondary | ICD-10-CM | POA: Insufficient documentation

## 2022-03-23 DIAGNOSIS — S61452A Open bite of left hand, initial encounter: Secondary | ICD-10-CM | POA: Insufficient documentation

## 2022-03-23 DIAGNOSIS — Y9389 Activity, other specified: Secondary | ICD-10-CM | POA: Diagnosis not present

## 2022-03-23 DIAGNOSIS — I10 Essential (primary) hypertension: Secondary | ICD-10-CM | POA: Diagnosis not present

## 2022-03-23 MED ORDER — METRONIDAZOLE 500 MG PO TABS
500.0000 mg | ORAL_TABLET | Freq: Once | ORAL | Status: AC
  Administered 2022-03-23: 500 mg via ORAL
  Filled 2022-03-23: qty 1

## 2022-03-23 MED ORDER — SULFAMETHOXAZOLE-TRIMETHOPRIM 800-160 MG PO TABS
1.0000 | ORAL_TABLET | Freq: Once | ORAL | Status: AC
  Administered 2022-03-23: 1 via ORAL
  Filled 2022-03-23: qty 1

## 2022-03-23 MED ORDER — METRONIDAZOLE 500 MG PO TABS: 500.0000 mg | ORAL_TABLET | Freq: Three times a day (TID) | ORAL | 0 refills | Status: AC

## 2022-03-23 MED ORDER — SULFAMETHOXAZOLE-TRIMETHOPRIM 800-160 MG PO TABS: 1.0000 | ORAL_TABLET | Freq: Two times a day (BID) | ORAL | 0 refills | Status: AC

## 2022-03-23 NOTE — Discharge Instructions (Addendum)
You were evaluated today after a dog bite to the left hand.  Please keep the wound clean.  I recommend letting the wound continue to heal by secondary intention.  I prescribed Flagyl and Bactrim for antibiotic prophylaxis.  Please complete the prescriptions.  I have provided follow-up information for hand surgery to be used as needed.

## 2022-03-23 NOTE — ED Provider Notes (Signed)
[facility]ERGENC[facility] DEPT Provider Note   CSN: [id number] Arrival date & time: 03/23/22  1319     History  Chief Complaint  Patient presents with   Animal Bite    [name] is a 57 y.o. female.  Patient presents the emergency department complaining of a dog bite to the left hand.  Patient states that she was bitten by her own dog and that the dog is up-to-date on its rabies vaccinations.  The patient had cleaned the skin and the skin was cleaned upon arrival with dermal cleaner with a gauze dressing applied.  Bleeding was controlled.  The patient was unsure about her last tetanus vaccination.  Past medical history significant for hypertension, ulnar nerve neuropathy, palpitations, major depressive disorder, atrial fibrillation  HPI     Home Medications Prior to Admission medications   Medication Sig Start Date End Date Taking? Authorizing Provider  metroNIDAZOLE (FLAGYL) 500 MG tablet Take 1 tablet (500 mg total) by mouth 3 (three) times daily for 5 days. 03/23/22 03/28/22 Yes Dorothyann Peng, PA-C  ondansetron (ZOFRAN) 8 MG tablet Take 1 tablet (8 mg total) by mouth every 8 (eight) hours as needed for nausea or vomiting. 03/03/22   Marin Olp, MD  sulfamethoxazole-trimethoprim (BACTRIM DS) 800-160 MG tablet Take 1 tablet by mouth 2 (two) times daily for 5 days. 03/23/22 03/28/22 Yes Dorothyann Peng, PA-C  albuterol (PROAIR HFA) 108 (90 BASE) MCG/ACT inhaler Inhale 2 puffs into the lungs every 6 (six) hours as needed for wheezing or shortness of breath.     [provider]  apixaban (ELIQUIS) 5 MG TABS tablet Take 1 tablet (5 mg total) by mouth 2 (two) times daily. 03/02/22   Marin Olp, MD  cholecalciferol (VITAMIN D) 25 MCG tablet Take 1 tablet (1,000 Units total) by mouth daily. 12/21/18   Love, Ivan Anchors, PA-C  eletriptan (RELPAX) 20 MG tablet Take 1 tablet (20 mg total) by mouth every 2 (two) hours as needed for migraine (max 2 doses per day).  02/02/22   Marin Olp, MD  EPINEPHrine 0.3 mg/0.3 mL IJ SOAJ injection Inject 0.3 mg into the muscle as needed for anaphylaxis. 01/19/22   Marin Olp, MD  estradiol (VIVELLE-DOT) 0.1 MG/24HR Place 1 patch onto the skin 2 (two) times a week.      [provider]  FLUoxetine (PROZAC) 40 MG capsule Take 1 capsule (40 mg total) by mouth daily. 02/10/22   Marin Olp, MD  furosemide (LASIX) 20 MG tablet Take 20 mg by mouth daily as needed for edema. Patient not taking: Reported on 01/19/2022    [provider]  levothyroxine (SYNTHROID) 50 MCG tablet Take 1 tablet (50 mcg total) by mouth daily before breakfast. 02/10/22   Marin Olp, MD  lisinopril (ZESTRIL) 10 MG tablet Take 1 tablet (10 mg total) by mouth daily. 02/10/22   Marin Olp, MD  metoprolol succinate (TOPROL-XL) 50 MG 24 hr tablet Take 0.5 tablets (25 mg total) by mouth 2 (two) times daily. 02/10/22   Marin Olp, MD  potassium chloride SA (K-DUR) 20 MEQ tablet Take 20 mEq by mouth daily as needed (when taking lasix).    [provider]  traZODone (DESYREL) 50 MG tablet Take 1 tablet (50 mg total) by mouth at bedtime as needed for sleep. 02/02/22   Marin Olp, MD  vitamin B-12 1000 MCG tablet Take 1 tablet (1,000 mcg total) by mouth daily. 12/21/18  Love, Pamela S, PA-C      Allergies    Bee venom, Latex, Adhesive [tape], Amoxicillin-pot clavulanate, and Erythromycin    Review of Systems   Review of Systems  Skin:  Positive for wound.    Physical Exam Updated Vital Signs BP (!) 131/93 (BP Location: Right Arm)   Pulse 65   Temp 98 F (36.7 C)   Resp 18   SpO2 99%  Physical Exam HENT:     Head: Normocephalic and atraumatic.  Cardiovascular:     Rate and Rhythm: Normal rate.  Pulmonary:     Effort: Pulmonary effort is normal. No respiratory distress.  Musculoskeletal:        General: No signs of injury.     Cervical back: Normal range of motion.   Skin:    General: Skin is dry.     Findings: Lesion present.     Comments: Puncture wound with skin tear on dorsal left hand. Bleeding controlled.   Neurological:     Mental Status: She is alert.  Psychiatric:        Speech: Speech normal.        Behavior: Behavior normal.     ED Results / Procedures / Treatments   Labs (all labs ordered are listed, but only abnormal results are displayed) Labs Reviewed - No data to display  EKG None  Radiology No results found.  Procedures Procedures    Medications Ordered in ED Medications  sulfamethoxazole-trimethoprim (BACTRIM DS) 800-160 MG per tablet 1 tablet (has no administration in time range)  metroNIDAZOLE (FLAGYL) tablet 500 mg (has no administration in time range)    ED Course/ Medical Decision Making/ A&P                           Medical Decision Making  Patient presents with a chief complaint of wound from dog bite on the left hand.  Patient has normal range of motion of the left hand and I see no tendon involvement upon physical inspection.  The wound has been cleaned and bandaged.  The patient was unsure about her tetanus vaccination history.  Her most recent Tdap vaccination on file was in September 2020.  There is no indication at this time for a tetanus booster.  The dog is in her custody and is up to date on rabies. There is no indication for rabies treatment  Plan to discharge patient home on course of antibiotics for prophylaxis.  Patient does not tolerate Augmentin.  Will discharge on Bactrim and Flagyl. Hand surgery follow up information provided to be used as needed       Final Clinical Impression(s) / ED Diagnoses Final diagnoses:  Dog bite, hand, left, initial encounter    Rx / DC Orders ED Discharge Orders          Ordered    metroNIDAZOLE (FLAGYL) 500 MG tablet  3 times daily        03/23/22 1611    sulfamethoxazole-trimethoprim (BACTRIM DS) 800-160 MG tablet  2 times daily        03/23/22  1611              Ronny Bacon 03/23/22 1612    Tretha Sciara, MD 03/23/22 2238

## 2022-03-23 NOTE — ED Triage Notes (Signed)
Pt was accidentally bit by her dog and has an injury on her left hand.  BLeeding controlled, cleaned skin tear with dermal cleaner and applied gauze dressing.  Pt is unsure about last tetanus

## 2022-03-23 NOTE — ED Notes (Signed)
Wound to left top hand cleaned and bacitracin applied with telfa dressing and wrapped.

## 2022-05-11 ENCOUNTER — Telehealth: Payer: Self-pay | Admitting: Family Medicine

## 2022-05-11 NOTE — Telephone Encounter (Signed)
Patient states: -Called in on Thurday, 02/15, and requested OV with PCP.  - She was told to call back on Monday morning since pcp was unavailable  - She has been experiencing dizziness for about 2 weeks; No other symptoms  I offered patient an OV with another provider but patient declined.

## 2022-05-12 ENCOUNTER — Ambulatory Visit: Payer: BC Managed Care – PPO | Admitting: Family Medicine

## 2022-05-12 ENCOUNTER — Encounter: Payer: Self-pay | Admitting: Family Medicine

## 2022-05-12 VITALS — BP 108/70 | HR 60 | Temp 97.2°F | Ht 70.0 in | Wt 266.6 lb

## 2022-05-12 DIAGNOSIS — H8110 Benign paroxysmal vertigo, unspecified ear: Secondary | ICD-10-CM | POA: Diagnosis not present

## 2022-05-12 DIAGNOSIS — F321 Major depressive disorder, single episode, moderate: Secondary | ICD-10-CM

## 2022-05-12 MED ORDER — ESCITALOPRAM OXALATE 20 MG PO TABS: 20.0000 mg | ORAL_TABLET | Freq: Every day | ORAL | 5 refills | Status: DC

## 2022-05-12 MED ORDER — MECLIZINE HCL 25 MG PO TABS: 25.0000 mg | ORAL_TABLET | Freq: Three times a day (TID) | ORAL | 0 refills | Status: DC | PRN

## 2022-05-12 NOTE — Patient Instructions (Addendum)
Sign release of information at the check out desk for [facility] do not take prozac or lexapro/escitalopram On Thursday do not take prozac and take half a lexapro 20 mg tablet for 3 days After that take full lexapro 67m  Check back in 2 weeks from now  Try to get plugged in with therapist  Restart old exercises OR  CRichrd Sox MD Vertigo Treatment Oct 11 [email or web address] Try meclizine   Recommended follow up: Return in about 2 months (around 07/11/2022) for followup or sooner if needed.Schedule b4 you leave.
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
## 2022-05-12 NOTE — Assessment & Plan Note (Signed)
#   Depression/anxiety/situational stress S: Medication: fluoxetine 40 mg through PCP- had been as high as 60 and tolerated well , trazodone for sleep -alprazolam in past with times of family illness- particularly loss of mom aug 2021- did grief counseling but still struggled  Recently she has noted increased stress.  Has had some change in how her workplace has been led-seems very money focused and she is very patient focused.  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  She feels like she has had attention issues and is concerned she may have ADD. Tasks are taking her longer and having a harder time focusing- may hit 3 more buttons to get to something on phone.  She is aware that stress can affect memory and concentration though.  On the other hand her mother had dementia so this worries her about this developing in her life  The loss of her mother in 2021 was extremely challenging enough she is worried about her father's health.Dad with bad squamous cell skin cancer- required mohs 3 years ago- insurance denied graft- finally got covered years later- went to remove margin and was positive.will need full flap reconstruction at outside location- going to try to get him set up at [place]- was told cancer could have invaded the skull. Started on immunotherapy.  Her father has had a mohs every 1-2 months for years- has been challenging. Has to be on long term valtrex- with herpes zoster ophthalmicus.    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 -She has also been on higher doses of fluoxetine in the past and we could consider this if she does not tolerate transition. - With degree of stress she is under I mentioned having her come out of work for a few days which she declines for now - With severity of symptoms we opted for 2-week follow-up - She is also trying to establish with EMDR therapy with [facility] or [facility]ne Ivan

## 2022-06-10 DIAGNOSIS — F411 Generalized anxiety disorder: Secondary | ICD-10-CM | POA: Diagnosis not present

## 2022-06-23 DIAGNOSIS — F411 Generalized anxiety disorder: Secondary | ICD-10-CM | POA: Diagnosis not present

## 2022-07-13 ENCOUNTER — Encounter: Payer: Self-pay | Admitting: Family Medicine

## 2022-07-13 ENCOUNTER — Ambulatory Visit: Payer: BC Managed Care – PPO | Admitting: Family Medicine

## 2022-07-13 VITALS — BP 114/72 | HR 62 | Temp 98.0°F | Resp 16 | Ht 69.5 in | Wt 247.2 lb

## 2022-07-13 DIAGNOSIS — I480 Paroxysmal atrial fibrillation: Secondary | ICD-10-CM | POA: Diagnosis not present

## 2022-07-13 DIAGNOSIS — F324 Major depressive disorder, single episode, in partial remission: Secondary | ICD-10-CM | POA: Diagnosis not present

## 2022-07-13 DIAGNOSIS — I10 Essential (primary) hypertension: Secondary | ICD-10-CM

## 2022-07-13 NOTE — Progress Notes (Signed)
Phone [phone] In person visit   Subjective:   [name] is a 57 y.o. 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   Hematoma of left lower extremity 12/27/2018    Priority: 1.   Multiple fractures of ribs, bilateral, sequela     Priority: 1.   PLANTAR FASCIITIS 08/13/2008    Priority: 1.   BPPV (benign paroxysmal positional vertigo) 05/12/2022    Medications- reviewed and updated Current Outpatient Medications  Medication Sig Dispense Refill   albuterol (PROAIR HFA) 108 (90 BASE) MCG/ACT inhaler Inhale 2 puffs into the lungs every 6 (six) hours as needed for wheezing or shortness of breath.      apixaban (ELIQUIS) 5 MG TABS tablet Take 1 tablet (5 mg total) by mouth 2 (two) times daily. 60 tablet 3   eletriptan (RELPAX) 20 MG tablet Take 1 tablet (20 mg total) by mouth every 2 (two) hours as needed for migraine (max 2 doses per day). 10 tablet 11   EPINEPHrine 0.3 mg/0.3 mL IJ SOAJ injection Inject 0.3 mg into the muscle as needed for anaphylaxis. 2 each 1   escitalopram (LEXAPRO) 20 MG tablet Take 1 tablet  (20 mg total) by mouth daily. 30 tablet 5   estradiol (VIVELLE-DOT) 0.1 MG/24HR Place 1 patch onto the skin 2 (two) times a week.       furosemide (LASIX) 20 MG tablet Take 20 mg by mouth daily as needed for edema.     levothyroxine (SYNTHROID) 50 MCG tablet Take 1 tablet (50 mcg total) by mouth daily before breakfast. 90 tablet 3   lisinopril (ZESTRIL) 10 MG tablet Take 1 tablet (10 mg total) by mouth daily. 90 tablet 3   meclizine (ANTIVERT) 25 MG tablet Take 1 tablet (25 mg total) by mouth 3 (three) times daily as needed (vertigo). 30 tablet 0   metoprolol succinate (TOPROL-XL) 50 MG 24 hr tablet Take 0.5 tablets (25 mg total) by mouth 2 (two) times daily. 90 tablet 3   ondansetron (ZOFRAN) 8 MG tablet Take 1 tablet (8 mg total) by mouth every 8 (eight) hours as needed for nausea or vomiting. 20 tablet 3   potassium chloride SA (K-DUR) 20 MEQ tablet Take 20 mEq by mouth daily as needed (when taking lasix).     traZODone (DESYREL) 50 MG tablet Take 1 tablet (50 mg total) by mouth at bedtime as needed for sleep. 90 tablet 3   vitamin B-12 1000 MCG tablet Take 1 tablet (1,000  mcg total) by mouth daily.     No current facility-administered medications for this visit.     Objective:  BP 114/72   Pulse 62   Temp 98 F (36.7 C) (Temporal)   Resp 16   Ht 5' 9.5" (1.765 m)   Wt 247 lb 3.2 oz (112.1 kg)   SpO2 100%   BMI 35.98 kg/m  Gen: NAD, resting comfortably CV: RRR no murmurs rubs or gallops Lungs: CTAB no crackles, wheeze, rhonchi Ext: trace edema Skin: warm, dry    Assessment and Plan   # Atrial fibrillation-follows with Dr. Willeen Cass with [facility] S: Rate controlled with metoprolol 12.5 mg extended release twice daily Anticoagulated with Eliquis 5 mg twice daily A/P: appropriately anticoagulated and rate controlled- continue current medicine    #hypertension/edema S: medication: lisinopril 10 mg, metoprolol 12.5 mg XR twice daily,  furosemide 20 mg but only as needed  for edema- very sparing still BP Readings from Last 3 Encounters:  07/13/22 114/72  05/12/22 108/70  03/23/22 136/89  A/P: stable- continue current medicines    #BENIGN PAROXYSMAL POSITIONAL VERTIGO (BPPV) last visit- thankfully resolved  # Depression/anxiety S: Medication: fluoxetine 40 mg through PCP- had been as high as 60 and tolerated well but at last visit we opted to trial Lexapro 20 mg with significant increase in recent stress particularly job-related-see prior note, trazodone for sleep -alprazolam in past with times of family illness- particularly loss of mom aug 2021- did grief counseling but still struggled -had mentioned adding Eye Movement Desensitization and Reprocessing (EMDR) therapy-she was open (see below)   Today reports some positives:  - She is dating someone and finds that encouraging (they are working through some things- he has trust issues) and even taking Teacher, adult education together -is going to go down for [employer] weekend in [place] [employer] and parasailing!  -feels medication change has been a good thing for her- somewhat helpful- does not feel as much anxiety as on Prozac - working with Burgess Estelle with Eye Movement Desensitization and Reprocessing (EMDR)- they have not started the EMDR yet  Barriers -dad remains sick- immunotherapy for skin cancer- inoperable -taking time for herself is hard as wants to be there for daughter -work remains very difficult    07/13/2022   11:33 AM 01/19/2022    3:19 PM 01/09/2019    2:35 PM  Depression screen PHQ 2/9  Decreased Interest 1 0 1  Down, Depressed, Hopeless 1 0 1  PHQ - 2 Score 2 0 2  Altered sleeping 3 0 0  Tired, decreased energy 2 0 1  Change in appetite 0 0 1  Feeling bad or failure about yourself  1 0 1  Trouble concentrating 3 0 0  Moving slowly or fidgety/restless 0 0 0  Suicidal thoughts 0 0 0  PHQ-9 Score 11 0 5  Difficult doing work/chores Somewhat difficult Not difficult at all    A/P: Depression  significantly improved-patient did not want to do PHQ-9 last time but was in a much better state and able to do so today-we opted to continue to work on improvement with therapy and continue current medication instead of adding adjunct medication  Recommended follow up: Return in about 7 months (around 02/12/2023) for physical or sooner if needed.Schedule b4 you leave.  I gave her the option of sooner check in but she preferred to keep next appointment physical unless she fails to continue to improve  Lab/Order associations:   ICD-10-CM   1. Depression,  major, single episode, in partial remission  F32.4     2. PAF (paroxysmal atrial fibrillation) Chronic I48.0     3. Essential hypertension  I10       No orders of the defined types were placed in this encounter.   Return precautions advised.  Tana Conch, MD

## 2022-07-13 NOTE — Patient Instructions (Addendum)
Team either submit e request from Dr. Henderson Cloud or lets Sign release of information at the check out desk for last mammogram and pap  Recommended follow up: Return in about 7 months (around 02/12/2023) for physical or sooner if needed.Schedule b4 you leave. Bloodwork next time

## 2022-07-21 ENCOUNTER — Ambulatory Visit: Payer: BC Managed Care – PPO | Admitting: Family Medicine

## 2022-07-21 DIAGNOSIS — F411 Generalized anxiety disorder: Secondary | ICD-10-CM | POA: Diagnosis not present

## 2022-08-06 DIAGNOSIS — F411 Generalized anxiety disorder: Secondary | ICD-10-CM | POA: Diagnosis not present

## 2022-08-28 ENCOUNTER — Encounter: Payer: Self-pay | Admitting: Physician Assistant

## 2022-08-28 ENCOUNTER — Ambulatory Visit: Payer: BC Managed Care – PPO | Admitting: Physician Assistant

## 2022-08-28 VITALS — BP 110/78 | HR 65 | Temp 97.5°F | Ht 69.5 in | Wt 241.5 lb

## 2022-08-28 DIAGNOSIS — R21 Rash and other nonspecific skin eruption: Secondary | ICD-10-CM | POA: Diagnosis not present

## 2022-08-28 MED ORDER — PREDNISONE 10 MG PO TABS: ORAL_TABLET | ORAL | 0 refills | Status: DC

## 2022-08-28 MED ORDER — METHYLPREDNISOLONE ACETATE 80 MG/ML IJ SUSP
80.0000 mg | Freq: Once | INTRAMUSCULAR | Status: AC
  Administered 2022-08-28: 80 mg via INTRAMUSCULAR

## 2022-08-28 MED ORDER — HYDROXYZINE PAMOATE 25 MG PO CAPS: 25.0000 mg | ORAL_CAPSULE | Freq: Three times a day (TID) | ORAL | 0 refills | Status: DC | PRN

## 2022-08-28 NOTE — Progress Notes (Signed)
[name] is a 57 y.o. female here for a new problem.  History of Present Illness:   Chief Complaint  Patient presents with   Rash    Pt c/o red rash started on left arm 2 weeks ago and now spreading to rest of her body, itching. Has tried Triamcinolone oint.    HPI  Rash Patient is complaining of a red and itchy rash on her left arm 2 weeks ago. She states that that the rash is now spreading to the rest of her body. 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 She checked her sheets for bed bugs and washed them in boiling water. Patient also suspected lice and treated it at such with permethrin cream with no results. She states that she has also been working in her yard and has managed sxs with triamcinolone ointment. Denies fatigue, body aches, fever, chills, recent travel, new medications, new detergents, and new soaps.   Past Medical History:  Diagnosis Date   Asthma    Atrial fibrillation (HCC)    Avulsion fracture of ankle 11/2018   right talar   Carpal tunnel syndrome of right wrist    Chest pain    negative cardiac cath   Complication of anesthesia    DEPRESSION 10/28/2009   GERD 05/11/2007   HYPERTENSION 06/18/2008   Major depressive disorder    OSA (obstructive sleep apnea)    with hypersomnia   Palpitation    Plantar fasciitis    PONV (postoperative nausea and vomiting)    Nausea sometimes   Ulnar nerve neuropathy      Social History   Tobacco Use   Smoking status: Never   Smokeless tobacco: Never  Vaping Use   Vaping Use: Never used  Substance Use Topics   Alcohol use: Never   Drug use: Never    Past Surgical History:  Procedure Laterality Date   ABDOMINAL HYSTERECTOMY  2016   LAVH with BSO   CESAREAN SECTION  07/28/2012   CHOLECYSTECTOMY     1995- hospitalized   FRACTURE SURGERY  12/12/2018   Orif left shoulder   I & D EXTREMITY Left 12/25/2018   Hematoma Procedure: IRRIGATION AND  DEBRIDEMENT;  Surgeon: Sheral Apley, MD;  Location: [facility] OR;  Service: Orthopedics;  Laterality: Left;   LAPAROSCOPIC GASTRIC BYPASS  2005   ORIF HUMERUS FRACTURE Left 12/12/2018   Procedure: OPEN REDUCTION INTERNAL FIXATION (ORIF) LEFT PROXIMAL HUMERUS FRACTURE;  Surgeon: Bjorn Pippin, MD;  Location: [facility] OR;  Service: Orthopedics;  Laterality: Left;    Family History  Problem Relation Age of Onset   Heart disease Mother        over 36 cabg   Aneurysm Mother        thoracic ascending- repair when had cabg   Arthritis Mother    COPD Mother        non smoker   Diabetes Mother    Hyperlipidemia Mother    Hypertension Mother    Kidney disease Mother    Miscarriages / Stillbirths Mother    Obesity Mother    Stroke Mother        over age 51   Varicose Veins Mother    Dementia Mother        died age 52. mixed with vascular and alzheiemers   Hemochromatosis Mother        homozygous   Arthritis Father    Skin cancer  Father        basal and squamous and melanoma in situ   Vision loss Father        after shingles   Alcohol abuse Brother    Arthritis Brother    Asthma Brother    Skin cancer Brother        squamous   Depression Brother    Drug abuse Brother    Hypertension Brother    Obesity Brother    COPD Maternal Grandmother    Heart disease Maternal Grandmother    Heart disease Maternal Grandfather    Stroke Maternal Grandfather    Arthritis Paternal Grandmother    Obesity Paternal Grandmother    Vision loss Paternal Grandmother    Varicose Veins Paternal Grandmother    Cancer Paternal Grandfather        MDS   Obesity Paternal Grandfather    Anxiety disorder Daughter    Asthma Daughter    Hearing loss Daughter        tubes when younger- later infection 25% loss membrane and needed surgery- choleastoma and then another surgery   Cancer Maternal Aunt        CML   Depression Maternal Aunt    Cancer Maternal Uncle        non hodgkins lymphoma   Heart disease  Maternal Uncle    Breast cancer Paternal Aunt    Melanoma Paternal Aunt        with mest   Breast cancer Paternal Aunt     Allergies  Allergen Reactions   Bee Venom Swelling    Localized swelling   Latex Shortness Of Breath and Rash   Adhesive [Tape]     Contact dermatitis    Amoxicillin-Pot Clavulanate Diarrhea    Did it involve swelling of the face/tongue/throat, SOB, or low BP? No Did it involve sudden or severe rash/hives, skin peeling, or any reaction on the inside of your mouth or nose? No Did you need to seek medical attention at a hospital or doctor's office? No When did it last happen?      10 + years If all above answers are "NO", may proceed with cephalosporin use.    Erythromycin Diarrhea    Current Medications:   Current Outpatient Medications:    albuterol (PROAIR HFA) 108 (90 BASE) MCG/ACT inhaler, Inhale 2 puffs into the lungs every 6 (six) hours as needed for wheezing or shortness of breath. , Disp: , Rfl:    apixaban (ELIQUIS) 5 MG TABS tablet, Take 1 tablet (5 mg total) by mouth 2 (two) times daily., Disp: 60 tablet, Rfl: 3   eletriptan (RELPAX) 20 MG tablet, Take 1 tablet (20 mg total) by mouth every 2 (two) hours as needed for migraine (max 2 doses per day)., Disp: 10 tablet, Rfl: 11   EPINEPHrine 0.3 mg/0.3 mL IJ SOAJ injection, Inject 0.3 mg into the muscle as needed for anaphylaxis., Disp: 2 each, Rfl: 1   escitalopram (LEXAPRO) 20 MG tablet, Take 1 tablet (20 mg total) by mouth daily., Disp: 30 tablet, Rfl: 5   estradiol (VIVELLE-DOT) 0.1 MG/24HR, Place 1 patch onto the skin 2 (two) times a week.  , Disp: , Rfl:    furosemide (LASIX) 20 MG tablet, Take 20 mg by mouth daily as needed for edema., Disp: , Rfl:    hydrOXYzine (VISTARIL) 25 MG capsule, Take 1-2 capsules (25-50 mg total) by mouth every 8 (eight) hours as needed for itching., Disp: 30 capsule, Rfl: 0  levothyroxine (SYNTHROID) 50 MCG tablet, Take 1 tablet (50 mcg total) by mouth daily before  breakfast., Disp: 90 tablet, Rfl: 3   lisinopril (ZESTRIL) 10 MG tablet, Take 1 tablet (10 mg total) by mouth daily., Disp: 90 tablet, Rfl: 3   meclizine (ANTIVERT) 25 MG tablet, Take 1 tablet (25 mg total) by mouth 3 (three) times daily as needed (vertigo)., Disp: 30 tablet, Rfl: 0   metoprolol succinate (TOPROL-XL) 50 MG 24 hr tablet, Take 0.5 tablets (25 mg total) by mouth 2 (two) times daily., Disp: 90 tablet, Rfl: 3   ondansetron (ZOFRAN) 8 MG tablet, Take 1 tablet (8 mg total) by mouth every 8 (eight) hours as needed for nausea or vomiting., Disp: 20 tablet, Rfl: 3   potassium chloride SA (K-DUR) 20 MEQ tablet, Take 20 mEq by mouth daily as needed (when taking lasix)., Disp: , Rfl:    predniSONE (DELTASONE) 10 MG tablet, Take 2 tablets daily x 1 week, then 1 tablet daily x 1 week, Disp: 21 tablet, Rfl: 0   traZODone (DESYREL) 50 MG tablet, Take 1 tablet (50 mg total) by mouth at bedtime as needed for sleep., Disp: 90 tablet, Rfl: 3   triamcinolone cream (KENALOG) 0.1 %, Apply 1 Application topically as needed., Disp: , Rfl:    vitamin B-12 1000 MCG tablet, Take 1 tablet (1,000 mcg total) by mouth daily., Disp:  , Rfl:    Vitamin D, Cholecalciferol, 25 MCG (1000 UT) CAPS, Take 1 capsule by mouth daily in the afternoon., Disp: , Rfl:    Review of Systems:   Review of Systems  Constitutional:  Negative for chills, fever and malaise/fatigue.  Skin:  Positive for itching and rash (left arm, now spreading over body).    Vitals:   Vitals:   08/28/22 0854  BP: 110/78  Pulse: 65  Temp: (!) 97.5 F (36.4 C)  TempSrc: Temporal  SpO2: 97%  Weight: 241 lb 8 oz (109.5 kg)  Height: 5' 9.5" (1.765 m)     Body mass index is 35.15 kg/m.  Physical Exam:   Physical Exam Constitutional:      Appearance: Normal appearance. She is well-developed.  HENT:     Head: Normocephalic and atraumatic.  Eyes:     General: Lids are normal.     Extraocular Movements: Extraocular movements intact.      Conjunctiva/sclera: Conjunctivae normal.  Pulmonary:     Effort: Pulmonary effort is normal.  Musculoskeletal:        General: Normal range of motion.     Cervical back: Normal range of motion and neck supple.  Skin:    General: Skin is warm and dry.     Comments: Right forearm with linear raised erythamatous lesion with excoriations Scattered erythematous patches to bilateral arms, abdomen, face  Neurological:     Mental Status: She is alert and oriented to person, place, and time.  Psychiatric:        Attention and Perception: Attention and perception normal.        Mood and Affect: Mood normal.        Behavior: Behavior normal.        Thought Content: Thought content normal.        Judgment: Judgment normal.     Assessment and Plan:   Rash and nonspecific skin eruption No red flags Suspect possible poison ivy/oak 80 mg depomedrol today Advised as follows: Continue claritin daily Start oral prednisone tomorrow and continue for two weeks Hydroxyzine 25-50 mg at  night for sleep and severe itching Message [place] if any concerns   I,Verona Buck,acting as a scribe for [employer], PA.,have documented all relevant documentation on the behalf of Jarold Motto, PA,as directed by  Jarold Motto, PA while in the presence of Jarold Motto, [place].  I, Jarold Motto, [place], have reviewed all documentation for this visit. The documentation on 08/28/22 for the exam, diagnosis, procedures, and orders are all accurate and complete.   Jarold Motto, PA-C

## 2022-08-28 NOTE — Patient Instructions (Addendum)
It was great to see you!  Continue claritin daily Start oral prednisone tomorrow and continue for two weeks  Hydroxyzine 25-50 mg at night for sleep and severe itching  Message [place] if any concerns  Take care,  Jarold Motto PA-C

## 2022-08-31 ENCOUNTER — Other Ambulatory Visit: Payer: Self-pay | Admitting: Physician Assistant

## 2022-08-31 ENCOUNTER — Encounter: Payer: Self-pay | Admitting: Physician Assistant

## 2022-08-31 MED ORDER — PREDNISONE 20 MG PO TABS: 40.0000 mg | ORAL_TABLET | Freq: Every day | ORAL | 0 refills | Status: DC

## 2022-09-17 DIAGNOSIS — F411 Generalized anxiety disorder: Secondary | ICD-10-CM | POA: Diagnosis not present

## 2022-10-01 DIAGNOSIS — F411 Generalized anxiety disorder: Secondary | ICD-10-CM | POA: Diagnosis not present

## 2022-12-01 ENCOUNTER — Other Ambulatory Visit: Payer: Self-pay | Admitting: Family Medicine

## 2022-12-01 DIAGNOSIS — E669 Obesity, unspecified: Secondary | ICD-10-CM | POA: Diagnosis not present

## 2022-12-01 DIAGNOSIS — I480 Paroxysmal atrial fibrillation: Secondary | ICD-10-CM | POA: Diagnosis not present

## 2022-12-01 DIAGNOSIS — I10 Essential (primary) hypertension: Secondary | ICD-10-CM | POA: Diagnosis not present

## 2022-12-01 DIAGNOSIS — G4733 Obstructive sleep apnea (adult) (pediatric): Secondary | ICD-10-CM | POA: Diagnosis not present

## 2022-12-10 DIAGNOSIS — F411 Generalized anxiety disorder: Secondary | ICD-10-CM | POA: Diagnosis not present

## 2022-12-24 DIAGNOSIS — F411 Generalized anxiety disorder: Secondary | ICD-10-CM | POA: Diagnosis not present

## 2023-01-07 ENCOUNTER — Other Ambulatory Visit: Payer: Self-pay | Admitting: Family Medicine

## 2023-02-01 ENCOUNTER — Encounter: Payer: Self-pay | Admitting: Family Medicine

## 2023-02-02 ENCOUNTER — Other Ambulatory Visit: Payer: Self-pay

## 2023-02-02 DIAGNOSIS — R5383 Other fatigue: Secondary | ICD-10-CM

## 2023-02-02 NOTE — Telephone Encounter (Signed)
Contacted pt and scheduled for labs . Informed pt that Pcp is booked up for CPE till April but that we can schedule with another provider for acute visit . Patient declined . States she is  currently managing symptoms at home .

## 2023-02-02 NOTE — Telephone Encounter (Signed)
Admin see below, labs have been placed ok to schedule lab visit.   Is [name] truly booked out until April 2025?

## 2023-02-04 ENCOUNTER — Encounter: Payer: Self-pay | Admitting: Family Medicine

## 2023-02-04 ENCOUNTER — Other Ambulatory Visit (INDEPENDENT_AMBULATORY_CARE_PROVIDER_SITE_OTHER): Payer: BC Managed Care – PPO

## 2023-02-04 DIAGNOSIS — R5383 Other fatigue: Secondary | ICD-10-CM

## 2023-02-04 LAB — COMPREHENSIVE METABOLIC PANEL
ALT: 25 U/L (ref 0–35)
AST: 38 U/L — ABNORMAL HIGH (ref 0–37)
Albumin: 3.7 g/dL (ref 3.5–5.2)
Alkaline Phosphatase: 118 U/L — ABNORMAL HIGH (ref 39–117)
BUN: 10 mg/dL (ref 6–23)
CO2: 29 meq/L (ref 19–32)
Calcium: 8.6 mg/dL (ref 8.4–10.5)
Chloride: 107 meq/L (ref 96–112)
Creatinine, Ser: 0.97 mg/dL (ref 0.40–1.20)
GFR: 64.81 mL/min (ref >60.00)
Glucose, Bld: 65 mg/dL — ABNORMAL LOW (ref 70–99)
Potassium: 3.4 meq/L — ABNORMAL LOW (ref 3.5–5.1)
Sodium: 141 meq/L (ref 135–145)
Total Bilirubin: 0.6 mg/dL (ref 0.2–1.2)
Total Protein: 6.2 g/dL (ref 6.0–8.3)

## 2023-02-04 LAB — CBC WITH DIFFERENTIAL/PLATELET
Basophils Absolute: 0 10*3/uL (ref 0.0–0.1)
Basophils Relative: 1.1 % (ref 0.0–3.0)
Eosinophils Absolute: 0.3 10*3/uL (ref 0.0–0.7)
Eosinophils Relative: 8.2 % — ABNORMAL HIGH (ref 0.0–5.0)
HCT: 34.7 % — ABNORMAL LOW (ref 36.0–46.0)
Hemoglobin: 11.6 g/dL — ABNORMAL LOW (ref 12.0–15.0)
Lymphocytes Relative: 34.9 % (ref 12.0–46.0)
Lymphs Abs: 1.3 10*3/uL (ref 0.7–4.0)
MCHC: 33.5 g/dL (ref 30.0–36.0)
MCV: 97.7 fL (ref 78.0–100.0)
Monocytes Absolute: 0.5 10*3/uL (ref 0.1–1.0)
Monocytes Relative: 12.3 % — ABNORMAL HIGH (ref 3.0–12.0)
Neutro Abs: 1.6 10*3/uL (ref 1.4–7.7)
Neutrophils Relative %: 43.5 % (ref 43.0–77.0)
Platelets: 187 10*3/uL (ref 150.0–400.0)
RBC: 3.56 Mil/uL — ABNORMAL LOW (ref 3.87–5.11)
RDW: 13.7 % (ref 11.5–15.5)
WBC: 3.7 10*3/uL — ABNORMAL LOW (ref 4.0–10.5)

## 2023-02-04 LAB — VITAMIN B12: Vitamin B-12: 587 pg/mL (ref 211–911)

## 2023-02-08 DIAGNOSIS — M25572 Pain in left ankle and joints of left foot: Secondary | ICD-10-CM | POA: Diagnosis not present

## 2023-02-09 NOTE — Telephone Encounter (Signed)
LVM to schedule ov or vv on 02/12/23 at 11:20 am or 02/15/23 at 4 pm. Can only hold slots for a short time.

## 2023-02-09 NOTE — Telephone Encounter (Signed)
Unfortunately, schedule is completely booked up until end of December. Do you want to work her in somewhere or can they see another provider?

## 2023-02-12 ENCOUNTER — Encounter: Payer: BC Managed Care – PPO | Admitting: Family Medicine

## 2023-02-15 ENCOUNTER — Ambulatory Visit: Payer: BC Managed Care – PPO | Admitting: Family Medicine

## 2023-02-15 VITALS — BP 106/64 | HR 76 | Temp 98.1°F | Ht 69.5 in | Wt 249.2 lb

## 2023-02-15 DIAGNOSIS — F321 Major depressive disorder, single episode, moderate: Secondary | ICD-10-CM

## 2023-02-15 DIAGNOSIS — E039 Hypothyroidism, unspecified: Secondary | ICD-10-CM

## 2023-02-15 DIAGNOSIS — I10 Essential (primary) hypertension: Secondary | ICD-10-CM | POA: Diagnosis not present

## 2023-02-15 NOTE — Progress Notes (Signed)
Phone [phone] In person visit   Subjective:   [name] is a 57 y.o. year old very pleasant female patient who presents for/with See problem oriented charting Chief Complaint  Patient presents with   discuss medication    Discuss lexapro dosage.   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   Hematoma of left lower extremity 12/27/2018    Priority: 1.   Multiple fractures of ribs, bilateral, sequela     Priority: 1.   PLANTAR FASCIITIS 08/13/2008    Priority: 1.   BPPV (benign paroxysmal positional vertigo) 05/12/2022    Medications- reviewed and updated Current Outpatient Medications  Medication Sig Dispense Refill   albuterol (PROAIR HFA) 108 (90 BASE) MCG/ACT inhaler Inhale 2 puffs into the lungs every 6 (six) hours as needed for wheezing or shortness of breath.      apixaban (ELIQUIS) 5 MG TABS tablet Take 1 tablet (5 mg total) by mouth 2 (two) times daily. 60 tablet 3   eletriptan (RELPAX) 20 MG tablet Take 1 tablet (20 mg total) by mouth every 2 (two) hours as needed for migraine (max 2 doses per day). 10 tablet 11   EPINEPHrine 0.3 mg/0.3 mL IJ SOAJ injection Inject 0.3 mg into the muscle as needed for anaphylaxis. 2 each 1    escitalopram (LEXAPRO) 20 MG tablet TAKE 1 TABLET BY MOUTH DAILY 30 tablet 2   estradiol (VIVELLE-DOT) 0.1 MG/24HR Place 1 patch onto the skin 2 (two) times a week.       furosemide (LASIX) 20 MG tablet Take 20 mg by mouth daily as needed for edema.     hydrOXYzine (VISTARIL) 25 MG capsule Take 1-2 capsules (25-50 mg total) by mouth every 8 (eight) hours as needed for itching. 30 capsule 0   levothyroxine (SYNTHROID) 50 MCG tablet TAKE 1 TABLET BY MOUTH DAILY BEFORE BREAKFAST 90 tablet 3   lisinopril (ZESTRIL) 10 MG tablet Take 1 tablet (10 mg total) by mouth daily. 90 tablet 3   meclizine (ANTIVERT) 25 MG tablet Take 1 tablet (25 mg total) by mouth 3 (three) times daily as needed (vertigo). 30 tablet 0   metoprolol succinate (TOPROL-XL) 50 MG 24 hr tablet Take 0.5 tablets (25 mg total) by mouth 2 (two) times daily. 90 tablet 3   ondansetron (ZOFRAN) 8 MG tablet Take 1 tablet (8 mg total) by mouth every 8 (eight) hours as needed for nausea or vomiting. 20 tablet 3   potassium chloride SA (K-DUR) 20 MEQ tablet Take 20 mEq by mouth daily as needed (when taking lasix).     traZODone (DESYREL) 50 MG  tablet Take 1 tablet (50 mg total) by mouth at bedtime as needed for sleep. 90 tablet 3   triamcinolone cream (KENALOG) 0.1 % Apply 1 Application topically as needed.     vitamin B-12 1000 MCG tablet Take 1 tablet (1,000 mcg total) by mouth daily.     Vitamin D, Cholecalciferol, 25 MCG (1000 UT) CAPS Take 1 capsule by mouth daily in the afternoon.     No current facility-administered medications for this visit.     Objective:  BP 106/64   Pulse 76   Temp 98.1 F (36.7 C)   Ht 5' 9.5" (1.765 m)   Wt 249 lb 3.2 oz (113 kg)   SpO2 98%   BMI 36.27 kg/m  Gen: NAD, resting comfortably CV: irregularly irregular  Lungs: CTAB no crackles, wheeze, rhonchi Ext: trace to 1+ edema Skin: warm, dry     Assessment and Plan   # Depression/anxiety S: Medication: Lexapro 20 mg, hydroxyzine 25 mg as needed  anxiety- not really using, trazodone 50 mg nightly as needed but can only get 5 hours with her schedule  Current stressors: work incredibly stressful and she doesn't agree with what is being asked of her, have changed cpu system, hard to get away from work, dad still very sick complex case with inoperable skin cancer, trying to pay daughters tuition- 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, imperfect dating situation  Support: still shag dancing, life coaching/CBT combo  - in past: vibryyd aggressive,  fluoxetine 40 mg through PCP- had been as high as 60 and tolerated well, alprazolam in past with times of family illness- particularly loss of mom aug 2021- did grief counseling but still struggled     02/15/2023    4:08 PM 07/13/2022   11:33 AM 01/19/2022    3:19 PM  Depression screen PHQ 2/9  Decreased Interest 1 1 0  Down, Depressed, Hopeless 2 1 0  PHQ - 2 Score 3 2 0  Altered sleeping 0 3 0  Tired, decreased energy 3 2 0  Change in appetite 0 0 0  Feeling bad or failure about yourself  2 1 0  Trouble concentrating 2 3 0  Moving slowly or fidgety/restless 0 0 0  Suicidal thoughts 1 0 0  PHQ-9 Score 11 11 0  Difficult doing work/chores Somewhat difficult Somewhat difficult Not difficult at all   A/P: Patient with baseline depression but with situational worsening primarily revolving around her job, caring for her father, financial strain and try to care for her daughter -She had reached out about potentially increasing her dose of medicine but she is on max dose of Lexapro. 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 Willeen Cass with [facility] S: Rate controlled with metoprolol 25 mg extended release twice daily-takes half of a 50 mg tablet Anticoagulated with Eliquis 5 mg twice daily A/P: appropriately anticoagulated and rate controlled- continue current medicine   #hypertension/edema S: medication: lisinopril 10 mg, metoprolol 25 mg XR,  furosemide 20 mg but only as needed for edema-very sparing but feels she might be able to use it now BP Readings from Last 3 Encounters:  02/15/23 106/64  08/28/22 110/78  07/13/22 114/72  A/P: stable- continue current medicines    #hypothyroidism S: compliant On thyroid medication-levothyroxine 50 mcg - was out for a week a few weeks ago Lab Results  Component Value Date   TSH 4.01 01/22/2022  A/P: When she came back for labs recently a TSH was not ordered-this is probably a good thing as she was out of medicine for about a week-she has been on it more consistently in the last week or 2-she will come back in 3 to 4 weeks for TSH recheck  Recommended follow up: Return for next already scheduled visit or sooner if needed. Future Appointments  Date Time Provider Department Center  07/20/2023  1:20 PM Shelva Majestic, MD [facility]    Lab/Order associations:   ICD-10-CM   1. Hypothyroidism, unspecified type  E03.9 TSH    2. Depression, major, single episode, moderate (HCC)  F32.1     3. Essential hypertension  I10      Return precautions advised.  Tana Conch, MD

## 2023-02-15 NOTE — Patient Instructions (Addendum)
So sorry to hear about your current situation with work in particular- we strongly considered Abilify but you wanted to think through some things first - you will reach out to me if you decide you would like to move forward in next few weeks  Come back for thyroid testing in 3-4 weeks  Recommended follow up: Return for next already scheduled visit or sooner if needed. We could always do a closer visit if you desire if things aren't improving

## 2023-02-16 NOTE — Telephone Encounter (Signed)
Patient saw provider and discussed concerns on  02/15/23

## 2023-05-17 ENCOUNTER — Other Ambulatory Visit: Payer: Self-pay | Admitting: Family Medicine

## 2023-05-18 ENCOUNTER — Other Ambulatory Visit: Payer: Self-pay | Admitting: Family Medicine

## 2023-05-19 ENCOUNTER — Other Ambulatory Visit: Payer: Self-pay

## 2023-05-19 ENCOUNTER — Telehealth: Payer: Self-pay

## 2023-05-19 DIAGNOSIS — I10 Essential (primary) hypertension: Secondary | ICD-10-CM

## 2023-05-19 MED ORDER — VITAMIN D (CHOLECALCIFEROL) 25 MCG (1000 UT) PO CAPS: 1.0000 | ORAL_CAPSULE | Freq: Every day | ORAL | 3 refills | Status: AC

## 2023-05-19 MED ORDER — METOPROLOL SUCCINATE ER 50 MG PO TB24: 25.0000 mg | ORAL_TABLET | Freq: Two times a day (BID) | ORAL | 3 refills | Status: AC

## 2023-05-19 MED ORDER — MECLIZINE HCL 25 MG PO TABS: 25.0000 mg | ORAL_TABLET | Freq: Three times a day (TID) | ORAL | 0 refills | Status: AC | PRN

## 2023-05-19 MED ORDER — FUROSEMIDE 20 MG PO TABS: 20.0000 mg | ORAL_TABLET | Freq: Every day | ORAL | 3 refills | Status: AC | PRN

## 2023-05-19 MED ORDER — ONDANSETRON HCL 8 MG PO TABS: 8.0000 mg | ORAL_TABLET | Freq: Three times a day (TID) | ORAL | 3 refills | Status: DC | PRN

## 2023-05-19 MED ORDER — ALBUTEROL SULFATE HFA 108 (90 BASE) MCG/ACT IN AERS: 2.0000 | INHALATION_SPRAY | Freq: Four times a day (QID) | RESPIRATORY_TRACT | 3 refills | Status: AC | PRN

## 2023-05-19 MED ORDER — POTASSIUM CHLORIDE CRYS ER 20 MEQ PO TBCR: 20.0000 meq | EXTENDED_RELEASE_TABLET | Freq: Every day | ORAL | 3 refills | Status: AC | PRN

## 2023-05-19 MED ORDER — EPINEPHRINE 0.3 MG/0.3ML IJ SOAJ: 0.3000 mg | INTRAMUSCULAR | 1 refills | Status: AC | PRN

## 2023-05-19 MED ORDER — ESCITALOPRAM OXALATE 20 MG PO TABS: 20.0000 mg | ORAL_TABLET | Freq: Every day | ORAL | 3 refills | Status: AC

## 2023-05-19 MED ORDER — APIXABAN 5 MG PO TABS: 5.0000 mg | ORAL_TABLET | Freq: Two times a day (BID) | ORAL | 3 refills | Status: DC

## 2023-05-19 MED ORDER — LEVOTHYROXINE SODIUM 50 MCG PO TABS: 50.0000 ug | ORAL_TABLET | Freq: Every day | ORAL | 3 refills | Status: DC

## 2023-05-19 MED ORDER — ELETRIPTAN HYDROBROMIDE 20 MG PO TABS: 20.0000 mg | ORAL_TABLET | ORAL | 3 refills | Status: DC | PRN

## 2023-05-19 MED ORDER — LISINOPRIL 10 MG PO TABS: 10.0000 mg | ORAL_TABLET | Freq: Every day | ORAL | 3 refills | Status: AC

## 2023-05-19 MED ORDER — HYDROXYZINE PAMOATE 25 MG PO CAPS: 25.0000 mg | ORAL_CAPSULE | Freq: Three times a day (TID) | ORAL | 0 refills | Status: AC | PRN

## 2023-05-19 MED ORDER — TRAZODONE HCL 50 MG PO TABS: 50.0000 mg | ORAL_TABLET | Freq: Every evening | ORAL | 3 refills | Status: DC | PRN

## 2023-05-19 NOTE — Telephone Encounter (Signed)
-----   Message from [name] sent at 05/19/2023 12:11 PM EST ----- Regarding: FW: Update Team- you can refill any of the requested medicines below as well as albuterol under my name  You can order lipid, CBC, CMP under hypertension for her to do at a time that works for her- help her set that up  Can you connect her with someone to help with mychart set up  [name]- glad you are feeling better. I hope the new job is a better fit for you. ----- Message ----- From: Carol Idol, FNP Sent: 05/19/2023   7:00 AM EST To: Carol Majestic, MD Subject: Update                                         Dr [name], Sorry to send to you this way but I am having trouble accessing my mychart account. My last day with [employer] as full time employee is Thursday 05/20/23. I am taking a contract position which has no benefits so I will be paying out of pocket for my insurance.  I am trying to fill any and all prescriptions I can to use up my flexible spending account. I have had a recent viral illness that I don't think was flu---didn't have the myalgias but I have had congestion, migraine, profuse diarrhea, epigastric abdominal pain and fever of 102 the first day.  No vomiting but couldn't eat or drink anything that I didn't have diarrhea. The main episode with diarrhea lasted about 20 hours then it would come sometimes after drinking.  I lost about 15 lbs in 1 week.  Am doing better now, no fever since Saturday.  Started with some nasal congestion and runny nose so I thought I was getting a cold then about 30 hours after the fever, abd pain and diarrhea.  Now I have not felt anything in my chest but I have a tickle and intermittent persistent cough with DOE like climbing stairs.  I was wondering if you could send in an inhaler for me because I think because of the chest tightness it has kicked off my asthma and I don't have a current inhaler. Also because I have had some migraines I could use a refill on my  Relpax sent to the [facility]. Also could use my Potassium to be refilled as well.  Call me if you have any questions or concerns and again sorry to intrude this way. Hope you and your family are well and will plan to see you in April.  Also if there are some labs you want for that physical can we get them now either today or tomorrow?  I will have a higher deductible and copay on the new insurance which looks like will remain [employer]. Respectfully, [name] [phone])

## 2023-05-20 ENCOUNTER — Other Ambulatory Visit (INDEPENDENT_AMBULATORY_CARE_PROVIDER_SITE_OTHER): Payer: BC Managed Care – PPO

## 2023-05-20 ENCOUNTER — Encounter: Payer: Self-pay | Admitting: Family Medicine

## 2023-05-20 DIAGNOSIS — I10 Essential (primary) hypertension: Secondary | ICD-10-CM | POA: Diagnosis not present

## 2023-05-20 DIAGNOSIS — E039 Hypothyroidism, unspecified: Secondary | ICD-10-CM

## 2023-05-20 LAB — LIPID PANEL
Cholesterol: 85 mg/dL (ref 0–200)
HDL: 26 mg/dL — ABNORMAL LOW (ref >39.00)
LDL Cholesterol: 47 mg/dL (ref 0–99)
NonHDL: 58.73
Total CHOL/HDL Ratio: 3
Triglycerides: 57 mg/dL (ref 0.0–149.0)
VLDL: 11.4 mg/dL (ref 0.0–40.0)

## 2023-05-20 LAB — CBC WITH DIFFERENTIAL/PLATELET
Basophils Absolute: 0 10*3/uL (ref 0.0–0.1)
Basophils Relative: 0.7 % (ref 0.0–3.0)
Eosinophils Absolute: 0.3 10*3/uL (ref 0.0–0.7)
Eosinophils Relative: 7 % — ABNORMAL HIGH (ref 0.0–5.0)
HCT: 37.8 % (ref 36.0–46.0)
Hemoglobin: 12.8 g/dL (ref 12.0–15.0)
Lymphocytes Relative: 47.1 % — ABNORMAL HIGH (ref 12.0–46.0)
Lymphs Abs: 1.7 10*3/uL (ref 0.7–4.0)
MCHC: 33.9 g/dL (ref 30.0–36.0)
MCV: 95.8 fL (ref 78.0–100.0)
Monocytes Absolute: 0.4 10*3/uL (ref 0.1–1.0)
Monocytes Relative: 11.7 % (ref 3.0–12.0)
Neutro Abs: 1.2 10*3/uL — ABNORMAL LOW (ref 1.4–7.7)
Neutrophils Relative %: 33.5 % — ABNORMAL LOW (ref 43.0–77.0)
Platelets: 227 10*3/uL (ref 150.0–400.0)
RBC: 3.95 Mil/uL (ref 3.87–5.11)
RDW: 13.3 % (ref 11.5–15.5)
WBC: 3.7 10*3/uL — ABNORMAL LOW (ref 4.0–10.5)

## 2023-05-20 LAB — COMPREHENSIVE METABOLIC PANEL
ALT: 19 U/L (ref 0–35)
AST: 27 U/L (ref 0–37)
Albumin: 3.7 g/dL (ref 3.5–5.2)
Alkaline Phosphatase: 103 U/L (ref 39–117)
BUN: 8 mg/dL (ref 6–23)
CO2: 27 meq/L (ref 19–32)
Calcium: 8.3 mg/dL — ABNORMAL LOW (ref 8.4–10.5)
Chloride: 106 meq/L (ref 96–112)
Creatinine, Ser: 0.94 mg/dL (ref 0.40–1.20)
GFR: 67.17 mL/min (ref >60.00)
Glucose, Bld: 117 mg/dL — ABNORMAL HIGH (ref 70–99)
Potassium: 3.6 meq/L (ref 3.5–5.1)
Sodium: 139 meq/L (ref 135–145)
Total Bilirubin: 0.6 mg/dL (ref 0.2–1.2)
Total Protein: 6.6 g/dL (ref 6.0–8.3)

## 2023-05-20 LAB — TSH: TSH: 2.14 u[IU]/mL (ref 0.35–5.50)

## 2023-06-14 DIAGNOSIS — M9903 Segmental and somatic dysfunction of lumbar region: Secondary | ICD-10-CM | POA: Diagnosis not present

## 2023-06-14 DIAGNOSIS — M9904 Segmental and somatic dysfunction of sacral region: Secondary | ICD-10-CM | POA: Diagnosis not present

## 2023-06-29 DIAGNOSIS — Z01419 Encounter for gynecological examination (general) (routine) without abnormal findings: Secondary | ICD-10-CM | POA: Diagnosis not present

## 2023-06-29 DIAGNOSIS — Z1231 Encounter for screening mammogram for malignant neoplasm of breast: Secondary | ICD-10-CM | POA: Diagnosis not present

## 2023-06-29 DIAGNOSIS — Z6837 Body mass index (BMI) 37.0-37.9, adult: Secondary | ICD-10-CM | POA: Diagnosis not present

## 2023-06-29 LAB — HM MAMMOGRAPHY

## 2023-07-01 LAB — HM PAP SMEAR

## 2023-07-20 ENCOUNTER — Encounter: Payer: Self-pay | Admitting: Family Medicine

## 2023-07-20 ENCOUNTER — Ambulatory Visit (INDEPENDENT_AMBULATORY_CARE_PROVIDER_SITE_OTHER): Payer: BC Managed Care – PPO | Admitting: Family Medicine

## 2023-07-20 VITALS — BP 108/60 | HR 61 | Temp 97.0°F | Ht 69.5 in | Wt 250.4 lb

## 2023-07-20 DIAGNOSIS — Z0000 Encounter for general adult medical examination without abnormal findings: Secondary | ICD-10-CM | POA: Diagnosis not present

## 2023-07-20 DIAGNOSIS — Z131 Encounter for screening for diabetes mellitus: Secondary | ICD-10-CM

## 2023-07-20 DIAGNOSIS — E538 Deficiency of other specified B group vitamins: Secondary | ICD-10-CM | POA: Diagnosis not present

## 2023-07-20 DIAGNOSIS — I10 Essential (primary) hypertension: Secondary | ICD-10-CM | POA: Diagnosis not present

## 2023-07-20 DIAGNOSIS — E669 Obesity, unspecified: Secondary | ICD-10-CM | POA: Diagnosis not present

## 2023-07-20 DIAGNOSIS — Z23 Encounter for immunization: Secondary | ICD-10-CM

## 2023-07-20 DIAGNOSIS — E039 Hypothyroidism, unspecified: Secondary | ICD-10-CM

## 2023-07-20 LAB — CBC WITH DIFFERENTIAL/PLATELET
Basophils Absolute: 0.1 10*3/uL (ref 0.0–0.1)
Basophils Relative: 1.3 % (ref 0.0–3.0)
Eosinophils Absolute: 0.4 10*3/uL (ref 0.0–0.7)
Eosinophils Relative: 7.5 % — ABNORMAL HIGH (ref 0.0–5.0)
HCT: 35.5 % — ABNORMAL LOW (ref 36.0–46.0)
Hemoglobin: 11.9 g/dL — ABNORMAL LOW (ref 12.0–15.0)
Lymphocytes Relative: 32.3 % (ref 12.0–46.0)
Lymphs Abs: 1.6 10*3/uL (ref 0.7–4.0)
MCHC: 33.5 g/dL (ref 30.0–36.0)
MCV: 96.5 fl (ref 78.0–100.0)
Monocytes Absolute: 0.4 10*3/uL (ref 0.1–1.0)
Monocytes Relative: 8.7 % (ref 3.0–12.0)
Neutro Abs: 2.4 10*3/uL (ref 1.4–7.7)
Neutrophils Relative %: 50.2 % (ref 43.0–77.0)
Platelets: 197 10*3/uL (ref 150.0–400.0)
RBC: 3.68 Mil/uL — ABNORMAL LOW (ref 3.87–5.11)
RDW: 14 % (ref 11.5–15.5)
WBC: 4.9 10*3/uL (ref 4.0–10.5)

## 2023-07-20 LAB — HEMOGLOBIN A1C: Hgb A1c MFr Bld: 5.4 % (ref 4.6–6.5)

## 2023-07-20 NOTE — Patient Instructions (Addendum)
 Prevnar 20 today  Team please give him exercises for biceps tendonitis I want you to do the exercise 3x a week for a month then once a week for another month. Stop any exercise that causes more than 1-2/10 pain increase. If not doing better within 1-2 months let us  refer you to sports medicine  Please stop by lab before you go If you have mychart- we will send your results within 3 business days of us  receiving them.  If you do not have mychart- we will call you about results within 5 business days of us  receiving them.  *please also note that you will see labs on mychart as soon as they post. I will later go in and write notes on them- will say "notes from Dr. Arlene Ben"    Recommended follow up: Return in about 6 months (around 01/19/2024) for followup or sooner if needed.Schedule b4 you leave.
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
## 2023-07-21 ENCOUNTER — Encounter: Payer: Self-pay | Admitting: Obstetrics and Gynecology

## 2023-07-23 ENCOUNTER — Encounter: Payer: Self-pay | Admitting: Family Medicine

## 2023-07-23 ENCOUNTER — Ambulatory Visit: Admitting: Family Medicine

## 2023-07-23 VITALS — BP 110/68 | HR 65 | Temp 97.3°F | Ht 69.5 in | Wt 250.4 lb

## 2023-07-23 DIAGNOSIS — B9689 Other specified bacterial agents as the cause of diseases classified elsewhere: Secondary | ICD-10-CM | POA: Diagnosis not present

## 2023-07-23 DIAGNOSIS — I10 Essential (primary) hypertension: Secondary | ICD-10-CM | POA: Diagnosis not present

## 2023-07-23 DIAGNOSIS — J4520 Mild intermittent asthma, uncomplicated: Secondary | ICD-10-CM

## 2023-07-23 DIAGNOSIS — J329 Chronic sinusitis, unspecified: Secondary | ICD-10-CM

## 2023-07-23 MED ORDER — AZITHROMYCIN 250 MG PO TABS: ORAL_TABLET | ORAL | 0 refills | Status: AC

## 2023-07-23 MED ORDER — PREDNISONE 20 MG PO TABS: ORAL_TABLET | ORAL | 0 refills | Status: DC

## 2023-07-23 NOTE — Patient Instructions (Addendum)
 Sinusitis and with Augmentin intolerance/allergy we opted to go with azithromycin which she tolerates well. Photosensitive rash on doxycycline in the past. 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

## 2023-07-23 NOTE — Progress Notes (Signed)
 Phone [phone] In person visit   Subjective:   [name] is a 58 y.o. year old very pleasant female patient who presents for/with See problem oriented charting Chief Complaint  Patient presents with   Sinus Problem    Pt c/o sinus pressure and yellow/green drainage.    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   Hematoma of left lower extremity 12/27/2018    Priority: 1.   Multiple fractures of ribs, bilateral, sequela     Priority: 1.   PLANTAR FASCIITIS 08/13/2008    Priority: 1.   BPPV (benign paroxysmal positional vertigo) 05/12/2022    Medications- reviewed and updated Current Outpatient Medications  Medication Sig Dispense Refill   albuterol  (PROAIR  HFA) 108 (90 Base) MCG/ACT inhaler Inhale 2 puffs into the lungs every 6 (six) hours as needed for wheezing or shortness of breath. 18 g 3   apixaban  (ELIQUIS ) 5 MG TABS tablet Take 1 tablet (5 mg total) by mouth 2 (two) times daily. 180 tablet 3   azithromycin (ZITHROMAX) 250 MG tablet Take 2 tablets on day 1, then 1 tablet daily on days 2 through 5 6 tablet 0   EPINEPHrine  0.3 mg/0.3 mL IJ SOAJ injection Inject 0.3 mg into the muscle as needed for anaphylaxis. 2 each 1    escitalopram  (LEXAPRO ) 20 MG tablet Take 1 tablet (20 mg total) by mouth daily. 90 tablet 3   estradiol  (VIVELLE -DOT) 0.1 MG/24HR Place 1 patch onto the skin 2 (two) times a week.       furosemide  (LASIX ) 20 MG tablet Take 1 tablet (20 mg total) by mouth daily as needed for edema. 90 tablet 3   hydrOXYzine  (VISTARIL ) 25 MG capsule Take 1-2 capsules (25-50 mg total) by mouth every 8 (eight) hours as needed for itching. 30 capsule 0   levothyroxine  (SYNTHROID ) 50 MCG tablet Take 1 tablet (50 mcg total) by mouth daily before breakfast. 90 tablet 3   lisinopril  (ZESTRIL ) 10 MG tablet Take 1 tablet (10 mg total) by mouth daily. 90 tablet 3   meclizine  (ANTIVERT ) 25 MG tablet Take 1 tablet (25 mg total) by mouth 3 (three) times daily as needed (vertigo). 30 tablet 0   metoprolol  succinate (TOPROL -XL) 50 MG 24 hr tablet Take 0.5 tablets (25 mg total) by mouth 2 (two) times daily. 180 tablet 3   ondansetron  (ZOFRAN ) 8 MG tablet Take 1 tablet (8 mg total) by mouth every 8 (eight) hours as needed. 20 tablet 3   potassium chloride  SA (KLOR-CON  M) 20 MEQ tablet Take 1 tablet (20 mEq total) by mouth daily as needed (when taking  lasix ). 90 tablet 3   predniSONE  (DELTASONE ) 20 MG tablet Take 2 pills for 3 days, 1 pill for 4 days 10 tablet 0   traZODone  (DESYREL ) 50 MG tablet Take 1 tablet (50 mg total) by mouth at bedtime as needed. for sleep 90 tablet 3   triamcinolone cream (KENALOG) 0.1 % Apply 1 Application topically as needed.     vitamin B-12 1000 MCG tablet Take 1 tablet (1,000 mcg total) by mouth daily.     Vitamin D , Cholecalciferol , 25 MCG (1000 UT) CAPS Take 1 capsule by mouth daily in the afternoon. 90 capsule 3   No current facility-administered medications for this visit.     Objective:  BP 110/68 (BP Location: Left Arm, Patient Position: Sitting, Cuff Size: Large)   Pulse 65   Temp (!) 97.3 F (36.3 C) (Temporal)   Ht 5' 9.5" (1.765 m)   Wt 250 lb 6.1 oz (113.6 kg)   SpO2 98%   BMI 36.44  kg/m  Gen: NAD, resting comfortably Tympanic membrane's normal, pharynx with some cobblestoning and signs of drainage/erythema, nasal turbinates edematous and erythematous with yellow discharge noted -Right frontal sinus tenderness noted CV: RRR no murmurs rubs or gallops Lungs: CTAB no crackles, wheeze, rhonchi Ext: Trace edema Skin: warm, dry     Assessment and Plan   # sinus pressure/congestion S:at physical on 07/20/23  mentioned had been dealing with allergies this season including runny nose, sneezing, congestion -since that visit has noted worsening cough and a color change in nasal discharge- green/yellow. Noting more sinus pressures - not wheezing but getting bad cough. Some chest congestion- has not tried albuterol  yet. Hasn't felt too tight yet. Nebs available if needed though.  - has not tested for COVID  A/P: Patient with history of baseline allergies and had reported some ongoing runny nose, sneezing, congestion.  Not currently on allergy medication but with significant flare since last visit of symptoms including sinus pressure, worsening sinus discharge and color of output concerning for bacterial superinfection-we will treat for bacterial Sinusitis and with Augmentin intolerance/allergy we opted to go with azithromycin which she tolerates well. Photosensitive rash on doxycycline in the past. 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  for her if she notes asthma exacerbation   #hypertension/edema S: medication: lisinopril  10 mg, metoprolol  25 mg XR twice daily ,  furosemide  20 mg but only as needed for edema very sparing- takes potassium with this BP Readings from Last 3 Encounters:  07/23/23 110/68  07/20/23 108/60  02/15/23 106/64   A/P: Blood pressure well-controlled-continue current medication-I think she has space to take  prednisone  with her blood pressure   #asthma- mild asthma with colds/illness- prn albuterol - hasn't needed yet and not wheezing today but as above want her to have prednisone  if needed available over weekend to reduce risk of Emergency Department or UC visit  Recommended follow up: Return for as needed for new, worsening, persistent symptoms. Future Appointments  Date Time Provider Department Center  01/19/2024  8:20 AM Almira Jaeger, MD [facility]    Lab/Order associations:   ICD-10-CM   1. Bacterial sinusitis  J32.9    B96.89     2. Essential hypertension  I10     3. Mild intermittent asthma without complication  J45.20       Meds ordered this encounter  Medications  azithromycin (ZITHROMAX) 250 MG tablet    Sig: Take 2 tablets on day 1, then 1 tablet daily on days 2 through 5    Dispense:  6 tablet    Refill:  0   predniSONE  (DELTASONE ) 20 MG tablet    Sig: Take 2 pills for 3 days, 1 pill for 4 days    Dispense:  10 tablet    Refill:  0    Return precautions advised.  Clarisa Crooked, MD

## 2024-01-12 ENCOUNTER — Other Ambulatory Visit: Payer: Self-pay | Admitting: Medical Genetics

## 2024-01-12 ENCOUNTER — Ambulatory Visit: Payer: Self-pay

## 2024-01-12 NOTE — Telephone Encounter (Signed)
 Scheduled to see Dr. Katrinka 01/13/2024. Dr. Katrinka will review triage notes.

## 2024-01-12 NOTE — Telephone Encounter (Signed)
 FYI Only or Action Required?: FYI only for provider.  Patient was last seen in primary care on 07/23/2023 by Katrinka Garnette KIDD, MD.  Called Nurse Triage reporting Rib Injury.  Symptoms began several days ago.  Interventions attempted: OTC medications: ibuprofen.  Symptoms are: gradually improving.  Triage Disposition: See PCP When Office is Open (Within 3 Days)  Patient/caregiver understands and will follow disposition?: Yes  Reason for Disposition  [1] After 3 days AND [2] chest pain not improved  Answer Assessment - Initial Assessment Questions 1. MECHANISM: How did the injury happen?     Bent over to pick up dog and felt rib pop 2. ONSET: When did the injury happen? (.e.g., minutes, hours, days ago)     Sunday, three days ago 3. LOCATION: Where on the chest is the injury located?     Right rib, under breast 4. APPEARANCE: What does the injury look like?     Denies bruising or swelling 5. BLEEDING: Is there any bleeding now? If Yes, ask: How long has it been bleeding?     denies 6. SEVERITY: Any difficulty with breathing?     denies 7. SIZE: For cuts, bruises, or swelling, ask: How large is it? (e.g., inches or centimeters)     N/A 8. PAIN: Is there pain? If Yes, ask: How bad is the pain? (e.g., Scale 0-10; none, mild, moderate, severe)     At rest, 2/10. With activity, 4/10. Sneeze or hiccup 7/10 9. TETANUS: For any breaks in the skin, ask: When was your last tetanus booster?     N/a 10. PREGNANCY: Is there any chance you are pregnant? When was your last menstrual period?       N/a  Protocols used: Chest Injury-A-AH

## 2024-01-13 ENCOUNTER — Ambulatory Visit: Admitting: Family Medicine

## 2024-01-14 ENCOUNTER — Ambulatory Visit: Admitting: Family Medicine

## 2024-01-14 ENCOUNTER — Encounter: Payer: Self-pay | Admitting: Family Medicine

## 2024-01-14 VITALS — BP 110/70 | HR 60 | Temp 98.0°F | Ht 69.5 in | Wt 261.8 lb

## 2024-01-14 DIAGNOSIS — I480 Paroxysmal atrial fibrillation: Secondary | ICD-10-CM

## 2024-01-14 DIAGNOSIS — I10 Essential (primary) hypertension: Secondary | ICD-10-CM | POA: Diagnosis not present

## 2024-01-14 DIAGNOSIS — R0789 Other chest pain: Secondary | ICD-10-CM | POA: Diagnosis not present

## 2024-01-14 DIAGNOSIS — F3342 Major depressive disorder, recurrent, in full remission: Secondary | ICD-10-CM

## 2024-01-14 MED ORDER — TRAMADOL HCL 50 MG PO TABS
50.0000 mg | ORAL_TABLET | Freq: Four times a day (QID) | ORAL | 0 refills | Status: AC | PRN
Start: 2024-01-14 — End: ?

## 2024-01-14 MED ORDER — TRAZODONE HCL 50 MG PO TABS: 50.0000 mg | ORAL_TABLET | Freq: Every evening | ORAL | 3 refills | Status: AC | PRN

## 2024-01-14 MED ORDER — ONDANSETRON 8 MG PO TBDP: 8.0000 mg | ORAL_TABLET | Freq: Three times a day (TID) | ORAL | 2 refills | Status: AC | PRN

## 2024-01-14 NOTE — Progress Notes (Signed)
 Phone [phone] In person visit   Subjective:   [name] is a 58 y.o. year old very pleasant female patient who presents for/with See problem oriented charting Chief Complaint  Patient presents with   Rib Injury    Bent over to pick up dog and felt rib pop 5 days ago. Right rib, under breast. No difficulty breathing. Just pain with deep breath, sneezing, hiccup and bending over to far. Pain level is around a 4 until movement that shoots up to a 7.  Did take an Aleve last night.    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   Hematoma of left lower extremity 12/27/2018    Priority: 1.   Multiple fractures of ribs, bilateral, sequela     Priority: 1.   PLANTAR FASCIITIS 08/13/2008    Priority: 1.   BPPV (benign paroxysmal positional vertigo) 05/12/2022    Medications- reviewed and updated Current Outpatient Medications  Medication Sig Dispense Refill   albuterol  (PROAIR  HFA) 108 (90 Base) MCG/ACT inhaler Inhale 2 puffs into the lungs every 6 (six)  hours as needed for wheezing or shortness of breath. 18 g 3   aspirin (ASPIRIN 81) 81 MG chewable tablet      cyanocobalamin  (VITAMIN B12) 1000 MCG tablet      EPINEPHrine  0.3 mg/0.3 mL IJ SOAJ injection Inject 0.3 mg into the muscle as needed for anaphylaxis. 2 each 1   escitalopram  (LEXAPRO ) 20 MG tablet Take 1 tablet (20 mg total) by mouth daily. 90 tablet 3   estradiol  (VIVELLE -DOT) 0.1 MG/24HR Place 1 patch onto the skin 2 (two) times a week.       furosemide  (LASIX ) 20 MG tablet Take 1 tablet (20 mg total) by mouth daily as needed for edema. 90 tablet 3   hydrOXYzine  (VISTARIL ) 25 MG capsule Take 1-2 capsules (25-50 mg total) by mouth every 8 (eight) hours as needed for itching. 30 capsule 0   levothyroxine  (SYNTHROID ) 50 MCG tablet Take 1 tablet (50 mcg total) by mouth daily before breakfast. 90 tablet 3   lisinopril  (ZESTRIL ) 10 MG tablet Take 1 tablet (10 mg total) by mouth daily. 90 tablet 3   meclizine  (ANTIVERT ) 25 MG tablet Take 1 tablet (25 mg total) by mouth 3 (three) times daily as needed (vertigo). 30 tablet 0   metoprolol  succinate (TOPROL -XL) 50 MG 24 hr tablet Take 0.5  tablets (25 mg total) by mouth 2 (two) times daily. 180 tablet 3   ondansetron  (ZOFRAN -ODT) 8 MG disintegrating tablet Take 1 tablet (8 mg total) by mouth every 8 (eight) hours as needed for nausea or vomiting. 20 tablet 2   potassium chloride  SA (KLOR-CON  M) 20 MEQ tablet Take 1 tablet (20 mEq total) by mouth daily as needed (when taking lasix ). 90 tablet 3   traMADol  (ULTRAM ) 50 MG tablet Take 1 tablet (50 mg total) by mouth every 6 (six) hours as needed for moderate pain (pain score 4-6) or severe pain (pain score 7-10). 15 tablet 0   triamcinolone cream (KENALOG) 0.1 % Apply 1 Application topically as needed.     vitamin B-12 1000 MCG tablet Take 1 tablet (1,000 mcg total) by mouth daily.     Vitamin D , Cholecalciferol , 25 MCG (1000 UT) CAPS Take 1 capsule by mouth daily in the afternoon. 90 capsule 3    traZODone  (DESYREL ) 50 MG tablet Take 1 tablet (50 mg total) by mouth at bedtime as needed. for sleep 90 tablet 3   No current facility-administered medications for this visit.     Objective:  BP 110/70 (BP Location: Left Arm, Patient Position: Sitting, Cuff Size: Normal)   Pulse 60   Temp 98 F (36.7 C) (Tympanic)   Ht 5' 9.5 (1.765 m)   Wt 261 lb 12.8 oz (118.8 kg)   SpO2 97%   BMI 38.11 kg/m  Gen: NAD, resting comfortably CV: RRR no murmurs rubs or gallops Very tender right lower rib Lungs: CTAB no crackles, wheeze, rhonchi  Ext: 1+ edema Skin: warm, dry     Assessment and Plan   #social update- new job still going very well. Lots of time down at [employer]. Daughter graduatees in may- considering nursing.  Reports her monthly premiums on insurance are going to increase drastically 400 dollars a month-this has been a stressor  # Right-sided rib pain S: 5 days ago patient was bending over to pick up her dog and felt her rib area have a intesne popping sensation with pain-right rib under her breast.  No shortness of breath.  Does have pain with deep breaths, sneezing, hiccups, yawning.  Pain around 4 out of 10 but can get up to 7/10 with movement.  Did use Aleve last night-has to be careful with aspirin and gastric bypass . Heat feels pretty good on it. Can wake her up if turns over - gynecology was going to DEXA- she has some concern if it were a fracture- can't get until next year.  A/P: Trial tramadol  as needed for this pain- strain vs fracture. We can do x-ray if desired but may not change management so we opted out for now. Don't use along with trazodone  - she knows if she develops shortness of breath or worsening cough to return to see us     # Atrial fibrillation-follows with Dr. Erie with [facility] S: Rate controlled with metoprolol  25 mg extended release twice daily Anticoagulated with asa 81 mg as cannot afford Eliquis  5 mg twice daily unfortunately A/P:  Unfortunately not on Eliquis  due to cost but think at least on aspirin but fortunately rate controlled- continue current medicine     #hypertension/edema S: medication: lisinopril  10 mg, metoprolol  25 mg XR twice daily ,  furosemide  20 mg but only as needed for edema,  very sparing- takes potassium with this BP Readings from Last 3 Encounters:  01/14/24 110/70  07/23/23 110/68  07/20/23 108/60  A/P: hypertension well controlled continue current medications - more lasix  trying to use over weekend  # Depression/anxiety S: Medication:Lexapro  20 g , trazodone  for sleep    01/14/2024    5:08 PM 07/20/2023    1:33 PM 02/15/2023    4:08 PM  Depression screen PHQ 2/9  Decreased Interest 0 0 1  Down, Depressed, Hopeless 0 0 2  PHQ - 2 Score 0 0 3  Altered sleeping 0 0 0  Tired, decreased energy 0 0 3  Change in appetite 1 0 0  Feeling bad or failure about yourself  0 0 2  Trouble concentrating 0 0 2  Moving slowly or fidgety/restless 0 0 0  Suicidal thoughts 0 0 1  PHQ-9 Score 1 0 11  Difficult doing work/chores Not difficult at all Not difficult at all Somewhat difficult  A/P: Patient reports dramatic improvement in symptoms with new work.  Full remission-continue current medication-she did need refill on trazodone  but knows not to use this along with tramadol   Recommended follow up: Return for as needed for new, worsening, persistent symptoms. Future Appointments  Date Time Provider Department Center  02/14/2024  3:00 PM [facility] None    Lab/Order associations:   ICD-10-CM   1. Rib pain on right side  R07.89     2. Essential hypertension  I10     3. Recurrent major depressive disorder, in full remission  F33.42     4. PAF (paroxysmal atrial fibrillation) (HCC)  I48.0       Meds ordered this encounter  Medications   traZODone  (DESYREL ) 50 MG tablet    Sig: Take 1 tablet (50 mg total) by mouth at bedtime as needed. for sleep    Dispense:  90 tablet     Refill:  3   ondansetron  (ZOFRAN -ODT) 8 MG disintegrating tablet    Sig: Take 1 tablet (8 mg total) by mouth every 8 (eight) hours as needed for nausea or vomiting.    Dispense:  20 tablet    Refill:  2   traMADol  (ULTRAM ) 50 MG tablet    Sig: Take 1 tablet (50 mg total) by mouth every 6 (six) hours as needed for moderate pain (pain score 4-6) or severe pain (pain score 7-10).    Dispense:  15 tablet    Refill:  0    Return precautions advised.  Garnette Lukes, MD

## 2024-01-14 NOTE — Patient Instructions (Addendum)
 Trial tramadol  as needed for this pain- strain vs fracture. We can do x-ray if desired but may not change management so we opted out for now. Don't use along with trazodone   Recommended follow up: Return for as needed for new, worsening, persistent symptoms.

## 2024-01-18 ENCOUNTER — Encounter: Payer: Self-pay | Admitting: Family Medicine

## 2024-01-18 NOTE — Telephone Encounter (Signed)
 Looks like I need to send a referral to sports med? Unless you did and for some reason I can't see it?

## 2024-01-19 ENCOUNTER — Other Ambulatory Visit: Payer: Self-pay | Admitting: Family Medicine

## 2024-01-19 ENCOUNTER — Ambulatory Visit: Admitting: Family Medicine

## 2024-01-19 DIAGNOSIS — R0789 Other chest pain: Secondary | ICD-10-CM

## 2024-01-24 NOTE — Progress Notes (Deleted)
 [facility] [address] [place] [address] Phone: [phone] Subjective:    I'm seeing this patient by the request  of:  Katrinka Garnette KIDD, MD  CC:   YEP:Dlagzrupcz  [name] is a 58 y.o. female coming in with complaint of R sided rib pain. Bent over to pick up dog and felt rib pop. Patient states        Past Medical History:  Diagnosis Date   Allergy    Arthritis    Asthma    Atrial fibrillation (HCC)    Avulsion fracture of ankle 11/2018   right talar   Carpal tunnel syndrome of right wrist    Chest pain    negative cardiac cath   Complication of anesthesia    DEPRESSION 10/28/2009   GERD 05/11/2007   Heart murmur    HYPERTENSION 06/18/2008   Major depressive disorder    OSA (obstructive sleep apnea)    with hypersomnia   Palpitation    Plantar fasciitis    PONV (postoperative nausea and vomiting)    Nausea sometimes   Sleep apnea    Thyroid disease    Ulnar nerve neuropathy    Past Surgical History:  Procedure Laterality Date   ABDOMINAL HYSTERECTOMY  2016   LAVH with BSO   CESAREAN SECTION  07/28/2012   CHOLECYSTECTOMY     1995- hospitalized   FRACTURE SURGERY  12/12/2018   Orif left shoulder   I & D EXTREMITY Left 12/25/2018   Hematoma Procedure: IRRIGATION AND DEBRIDEMENT;  Surgeon: Beverley Evalene BIRCH, MD;  [facility]: [facility] OR;  Service: Orthopedics;  Laterality: Left;   LAPAROSCOPIC GASTRIC BYPASS  2005   ORIF HUMERUS FRACTURE Left 12/12/2018   Procedure: OPEN REDUCTION INTERNAL FIXATION (ORIF) LEFT PROXIMAL HUMERUS FRACTURE;  Surgeon: Cristy Bonner DASEN, MD;  Location: [facility] OR;  Service: Orthopedics;  Laterality: Left;   Social History   Socioeconomic History   Marital status: Single    Spouse name: Not on file   Number of children: 1   Years of education: Not on file   Highest education level: Master's degree (e.g., MA, MS, MEng, MEd, MSW, MBA)  Occupational History   Not on file  Tobacco Use   Smoking  status: Never   Smokeless tobacco: Never  Vaping Use   Vaping status: Never Used  Substance and Sexual Activity   Alcohol use: Yes    Comment: Have 1-2 per month   Drug use: Never   Sexual activity: Never    Comment: artificial insemination  Other Topics Concern   Not on file  Social History Narrative   Lives with daughter 58 in 2023- new home 2023. Puppy- part fox hound part beagle.       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      Hobbies: enjoys traveling- likes the beach- has been to [place]  in the past after her mom passed, horseback riding, photogrpahy   Social Drivers of Health   Financial Resource Strain: Low Risk  (01/12/2024)   Overall Financial Resource Strain (CARDIA)    Difficulty of Paying Living Expenses: Not very hard  Food Insecurity: No Food Insecurity (01/12/2024)   Hunger Vital Sign    Worried About Running Out of Food in the Last Year: Never true    Ran Out of Food in the Last Year: Never true  Transportation Needs:  No Transportation Needs (01/12/2024)   PRAPARE - Administrator, Civil Service (Medical): No    Lack of Transportation (Non-Medical): No  Physical Activity: Insufficiently Active (01/12/2024)   Exercise Vital Sign    Days of Exercise per Week: 2 days    Minutes of Exercise per Session: 60 min  Stress: No Stress Concern Present (01/12/2024)   [employer] of Occupational Health - Occupational Stress Questionnaire    Feeling of Stress: Only a little  Social Connections: Moderately Integrated (01/12/2024)   Social Connection and Isolation Panel    Frequency of Communication with Friends and Family: More than three times a week    Frequency of Social Gatherings with Friends and Family: Twice a week    Attends Religious Services: More than 4 times per year    Active Member of [employer] or Organizations: Yes    Attends Hospital Doctor: More than 4 times per year    Marital Status: Never married   Allergies  Allergen Reactions   Bee Venom Swelling    Localized swelling   Latex Rash, Shortness Of Breath and Other (See Comments)    latex   Adhesive [Tape]     Contact dermatitis    Amoxicillin-Pot Clavulanate Diarrhea    Did it involve swelling of the face/tongue/throat, SOB, or low BP? No Did it involve sudden or severe rash/hives, skin peeling, or any reaction on the inside of your mouth or nose? No Did you need to seek medical attention at a hospital or doctor's office? No When did it last happen?      10 + years If all above answers are "NO", may proceed with cephalosporin use.    Erythromycin Diarrhea   Family History  Problem Relation Age of Onset   Heart disease Mother        over 52 cabg   Aneurysm Mother        thoracic ascending- repair when had cabg   Arthritis Mother    COPD Mother        non smoker   Diabetes Mother    Hyperlipidemia Mother    Hypertension Mother    Kidney disease Mother    Miscarriages / Stillbirths Mother    Obesity Mother    Stroke Mother        over age 9   Varicose Veins Mother    Dementia Mother        died age 53. mixed with vascular and alzheiemers   Hemochromatosis Mother        homozygous   Arthritis Father    Skin cancer Father        basal and squamous and melanoma in situ   Vision loss Father        after shingles   Cancer Father    Kidney disease Father    Alcohol abuse Brother    Arthritis Brother    Asthma Brother    Skin cancer Brother        squamous   Depression Brother    Drug abuse Brother    Hypertension Brother    Obesity Brother    COPD Maternal Grandmother    Heart disease Maternal Grandmother    Heart disease Maternal Grandfather    Stroke Maternal Grandfather    Arthritis Paternal Grandmother    Obesity Paternal Grandmother    Vision loss Paternal Grandmother    Varicose Veins Paternal Grandmother    Cancer Paternal  Grandfather  MDS   Obesity Paternal Grandfather    Anxiety disorder Daughter    Asthma Daughter    Hearing loss Daughter        tubes when younger- later infection 25% loss membrane and needed surgery- choleastoma and then another surgery   Cancer Maternal Aunt        CML   Depression Maternal Aunt    Cancer Maternal Uncle        non hodgkins lymphoma   Heart disease Maternal Uncle    Breast cancer Paternal Aunt    Melanoma Paternal Aunt        with mest   Breast cancer Paternal Aunt     Current Outpatient Medications (Endocrine & Metabolic):    estradiol  (VIVELLE -DOT) 0.1 MG/24HR, Place 1 patch onto the skin 2 (two) times a week.     levothyroxine  (SYNTHROID ) 50 MCG tablet, Take 1 tablet (50 mcg total) by mouth daily before breakfast.  Current Outpatient Medications (Cardiovascular):    EPINEPHrine  0.3 mg/0.3 mL IJ SOAJ injection, Inject 0.3 mg into the muscle as needed for anaphylaxis.   furosemide  (LASIX ) 20 MG tablet, Take 1 tablet (20 mg total) by mouth daily as needed for edema.   lisinopril  (ZESTRIL ) 10 MG tablet, Take 1 tablet (10 mg total) by mouth daily.   metoprolol  succinate (TOPROL -XL) 50 MG 24 hr tablet, Take 0.5 tablets (25 mg total) by mouth 2 (two) times daily.  Current Outpatient Medications (Respiratory):    albuterol  (PROAIR  HFA) 108 (90 Base) MCG/ACT inhaler, Inhale 2 puffs into the lungs every 6 (six) hours as needed for wheezing or shortness of breath.  Current Outpatient Medications (Analgesics):    aspirin (ASPIRIN 81) 81 MG chewable tablet,    traMADol  (ULTRAM ) 50 MG tablet, Take 1 tablet (50 mg total) by mouth every 6 (six) hours as needed for moderate pain (pain score 4-6) or severe pain (pain score 7-10).  Current Outpatient Medications (Hematological):    cyanocobalamin  (VITAMIN B12) 1000 MCG tablet,    vitamin B-12 1000 MCG tablet, Take 1 tablet (1,000 mcg total) by mouth daily.  Current Outpatient Medications (Other):    escitalopram   (LEXAPRO ) 20 MG tablet, Take 1 tablet (20 mg total) by mouth daily.   hydrOXYzine  (VISTARIL ) 25 MG capsule, Take 1-2 capsules (25-50 mg total) by mouth every 8 (eight) hours as needed for itching.   meclizine  (ANTIVERT ) 25 MG tablet, Take 1 tablet (25 mg total) by mouth 3 (three) times daily as needed (vertigo).   ondansetron  (ZOFRAN -ODT) 8 MG disintegrating tablet, Take 1 tablet (8 mg total) by mouth every 8 (eight) hours as needed for nausea or vomiting.   potassium chloride  SA (KLOR-CON  M) 20 MEQ tablet, Take 1 tablet (20 mEq total) by mouth daily as needed (when taking lasix ).   traZODone  (DESYREL ) 50 MG tablet, Take 1 tablet (50 mg total) by mouth at bedtime as needed. for sleep   triamcinolone cream (KENALOG) 0.1 %, Apply 1 Application topically as needed.   Vitamin D , Cholecalciferol , 25 MCG (1000 UT) CAPS, Take 1 capsule by mouth daily in the afternoon.   Reviewed prior external information including notes and imaging from  primary care provider As well as notes that were available from care everywhere and other healthcare systems.  Past medical history, social, surgical and family history all reviewed in electronic medical record.  No pertanent information unless stated regarding to the chief complaint.   Review of Systems:  No headache, visual changes, nausea, vomiting, diarrhea, constipation, dizziness,  abdominal pain, skin rash, fevers, chills, night sweats, weight loss, swollen lymph nodes, body aches, joint swelling, chest pain, shortness of breath, mood changes. POSITIVE muscle aches  Objective  There were no vitals taken for this visit.   General: No apparent distress alert and oriented x3 mood and affect normal, dressed appropriately.  HEENT: Pupils equal, extraocular movements intact  Respiratory: Patient's speak in full sentences and does not appear short of breath  Cardiovascular: No lower extremity edema, non tender, no erythema      Impression and Recommendations:

## 2024-01-26 ENCOUNTER — Ambulatory Visit: Admitting: Family Medicine

## 2024-02-02 ENCOUNTER — Ambulatory Visit: Admitting: Sports Medicine

## 2024-02-14 ENCOUNTER — Other Ambulatory Visit

## 2024-02-14 DIAGNOSIS — Z006 Encounter for examination for normal comparison and control in clinical research program: Secondary | ICD-10-CM

## 2024-02-26 LAB — GENECONNECT MOLECULAR SCREEN: Genetic Analysis Overall Interpretation: NEGATIVE

## 2024-04-02 ENCOUNTER — Other Ambulatory Visit: Payer: Self-pay | Admitting: Family Medicine
